# Patient Record
Sex: Female | Born: 1970 | Race: Black or African American | Hispanic: No | Marital: Single | Smoking: Never smoker
Health system: Southern US, Community
[De-identification: ages and names within clinical notes are randomized; demographics above are authoritative.]

## PROBLEM LIST (undated history)

## (undated) DIAGNOSIS — H9192 Unspecified hearing loss, left ear: Secondary | ICD-10-CM

## (undated) DIAGNOSIS — D573 Sickle-cell trait: Secondary | ICD-10-CM

## (undated) DIAGNOSIS — Z973 Presence of spectacles and contact lenses: Secondary | ICD-10-CM

## (undated) DIAGNOSIS — I10 Essential (primary) hypertension: Secondary | ICD-10-CM

## (undated) DIAGNOSIS — K5792 Diverticulitis of intestine, part unspecified, without perforation or abscess without bleeding: Secondary | ICD-10-CM

## (undated) HISTORY — PX: BREAST BIOPSY: SHX20

## (undated) HISTORY — DX: Diverticulitis of intestine, part unspecified, without perforation or abscess without bleeding: K57.92

## (undated) HISTORY — DX: Essential (primary) hypertension: I10

## (undated) HISTORY — DX: Presence of spectacles and contact lenses: Z97.3

## (undated) HISTORY — DX: Unspecified hearing loss, left ear: H91.92

## (undated) HISTORY — PX: TUBAL LIGATION: SHX77

## (undated) HISTORY — DX: Sickle-cell trait: D57.3

---

## 1998-06-23 ENCOUNTER — Inpatient Hospital Stay (HOSPITAL_COMMUNITY): Admission: AD | Admit: 1998-06-23 | Discharge: 1998-06-23 | Payer: Self-pay | Admitting: Gynecology

## 1998-07-11 ENCOUNTER — Inpatient Hospital Stay (HOSPITAL_COMMUNITY): Admission: AD | Admit: 1998-07-11 | Discharge: 1998-07-11 | Payer: Self-pay | Admitting: Obstetrics and Gynecology

## 1998-08-22 ENCOUNTER — Inpatient Hospital Stay (HOSPITAL_COMMUNITY): Admission: AD | Admit: 1998-08-22 | Discharge: 1998-08-25 | Payer: Self-pay | Admitting: Gynecology

## 1998-08-22 ENCOUNTER — Encounter (INDEPENDENT_AMBULATORY_CARE_PROVIDER_SITE_OTHER): Payer: Self-pay

## 1998-08-31 ENCOUNTER — Emergency Department (HOSPITAL_COMMUNITY): Admission: EM | Admit: 1998-08-31 | Discharge: 1998-08-31 | Payer: Self-pay | Admitting: Emergency Medicine

## 1998-08-31 ENCOUNTER — Encounter: Payer: Self-pay | Admitting: Emergency Medicine

## 1998-09-29 ENCOUNTER — Other Ambulatory Visit: Admission: RE | Admit: 1998-09-29 | Discharge: 1998-09-29 | Payer: Self-pay | Admitting: Gynecology

## 1999-01-08 ENCOUNTER — Encounter: Admission: RE | Admit: 1999-01-08 | Discharge: 1999-01-08 | Payer: Self-pay | Admitting: Obstetrics

## 1999-01-08 ENCOUNTER — Other Ambulatory Visit: Admission: RE | Admit: 1999-01-08 | Discharge: 1999-01-08 | Payer: Self-pay | Admitting: Obstetrics

## 2007-01-19 ENCOUNTER — Emergency Department (HOSPITAL_COMMUNITY): Admission: EM | Admit: 2007-01-19 | Discharge: 2007-01-19 | Payer: Self-pay | Admitting: Emergency Medicine

## 2007-01-23 ENCOUNTER — Encounter: Admission: RE | Admit: 2007-01-23 | Discharge: 2007-01-23 | Payer: Self-pay | Admitting: Emergency Medicine

## 2007-01-24 ENCOUNTER — Encounter (INDEPENDENT_AMBULATORY_CARE_PROVIDER_SITE_OTHER): Payer: Self-pay | Admitting: Diagnostic Radiology

## 2007-01-24 ENCOUNTER — Encounter: Admission: RE | Admit: 2007-01-24 | Discharge: 2007-01-24 | Payer: Self-pay | Admitting: Emergency Medicine

## 2007-03-07 ENCOUNTER — Encounter: Admission: RE | Admit: 2007-03-07 | Discharge: 2007-03-07 | Payer: Self-pay | Admitting: Emergency Medicine

## 2007-03-21 ENCOUNTER — Encounter: Admission: RE | Admit: 2007-03-21 | Discharge: 2007-03-21 | Payer: Self-pay | Admitting: Emergency Medicine

## 2007-03-28 ENCOUNTER — Encounter: Admission: RE | Admit: 2007-03-28 | Discharge: 2007-03-28 | Payer: Self-pay | Admitting: Emergency Medicine

## 2007-06-26 ENCOUNTER — Encounter: Admission: RE | Admit: 2007-06-26 | Discharge: 2007-06-26 | Payer: Self-pay | Admitting: Emergency Medicine

## 2010-03-29 ENCOUNTER — Encounter: Payer: Self-pay | Admitting: Emergency Medicine

## 2010-09-27 ENCOUNTER — Emergency Department (HOSPITAL_COMMUNITY)
Admission: EM | Admit: 2010-09-27 | Discharge: 2010-09-27 | Disposition: A | Discharge status: Home / Self Care | Payer: Self-pay | Attending: Emergency Medicine | Admitting: Emergency Medicine

## 2010-09-27 ENCOUNTER — Emergency Department (HOSPITAL_COMMUNITY): Payer: Self-pay

## 2010-09-27 ENCOUNTER — Inpatient Hospital Stay (INDEPENDENT_AMBULATORY_CARE_PROVIDER_SITE_OTHER)
Admission: RE | Admit: 2010-09-27 | Discharge: 2010-09-27 | Disposition: A | Discharge status: Home / Self Care | Payer: Self-pay | Source: Ambulatory Visit | Attending: Family Medicine | Admitting: Family Medicine

## 2010-09-27 DIAGNOSIS — H538 Other visual disturbances: Secondary | ICD-10-CM | POA: Insufficient documentation

## 2010-09-27 DIAGNOSIS — R51 Headache: Secondary | ICD-10-CM

## 2010-09-27 DIAGNOSIS — H53149 Visual discomfort, unspecified: Secondary | ICD-10-CM | POA: Insufficient documentation

## 2010-09-27 DIAGNOSIS — R112 Nausea with vomiting, unspecified: Secondary | ICD-10-CM | POA: Insufficient documentation

## 2010-09-27 DIAGNOSIS — G43909 Migraine, unspecified, not intractable, without status migrainosus: Secondary | ICD-10-CM | POA: Insufficient documentation

## 2010-09-27 LAB — POCT I-STAT, CHEM 8
BUN: 15 mg/dL (ref 6–23)
Calcium, Ion: 1.02 mmol/L — ABNORMAL LOW (ref 1.12–1.32)
Creatinine, Ser: 0.7 mg/dL (ref 0.50–1.10)
Glucose, Bld: 80 mg/dL (ref 70–99)
Potassium: 3.5 mEq/L (ref 3.5–5.1)
Sodium: 138 mEq/L (ref 135–145)

## 2010-10-16 ENCOUNTER — Emergency Department (HOSPITAL_COMMUNITY)
Admission: EM | Admit: 2010-10-16 | Discharge: 2010-10-16 | Disposition: A | Discharge status: Home / Self Care | Payer: No Typology Code available for payment source | Attending: Emergency Medicine | Admitting: Emergency Medicine

## 2010-10-16 DIAGNOSIS — M542 Cervicalgia: Secondary | ICD-10-CM | POA: Insufficient documentation

## 2010-10-16 DIAGNOSIS — Y92410 Unspecified street and highway as the place of occurrence of the external cause: Secondary | ICD-10-CM | POA: Insufficient documentation

## 2010-10-16 DIAGNOSIS — M545 Low back pain, unspecified: Secondary | ICD-10-CM | POA: Insufficient documentation

## 2011-08-10 ENCOUNTER — Encounter (HOSPITAL_COMMUNITY): Payer: Self-pay | Admitting: Emergency Medicine

## 2011-08-10 ENCOUNTER — Emergency Department (INDEPENDENT_AMBULATORY_CARE_PROVIDER_SITE_OTHER)
Admission: EM | Admit: 2011-08-10 | Discharge: 2011-08-10 | Disposition: A | Discharge status: Home / Self Care | Payer: BC Managed Care – PPO | Source: Home / Self Care | Attending: Emergency Medicine | Admitting: Emergency Medicine

## 2011-08-10 DIAGNOSIS — R42 Dizziness and giddiness: Secondary | ICD-10-CM

## 2011-08-10 DIAGNOSIS — R220 Localized swelling, mass and lump, head: Secondary | ICD-10-CM

## 2011-08-10 LAB — GLUCOSE, CAPILLARY: Glucose-Capillary: 111 mg/dL — ABNORMAL HIGH (ref 70–99)

## 2011-08-10 MED ORDER — OMEPRAZOLE 20 MG PO CPDR: 40.0000 mg | DELAYED_RELEASE_CAPSULE | Freq: Every day | ORAL | Status: DC

## 2011-08-10 MED ORDER — CEPHALEXIN 500 MG PO CAPS: 500.0000 mg | ORAL_CAPSULE | Freq: Three times a day (TID) | ORAL | Status: AC

## 2011-08-10 NOTE — Discharge Instructions (Signed)
1- skull scar or fibrotic tissue. Will need to be excised electively by a surgeon or cosmetic surgeon. Once you establish her primary care Dr. this becomes a priority for you informed and they will prefer you.   2-if you get to express any symptoms such as chest pains, the sensation that he doesn't pass out, palpitations, chest pains, numbness or weakness or visual changes he should go to the emergency department or call 911.  3-can take omeprazole for your discomfort as you probably irritated your esophagus.       Dizziness Dizziness is a common problem. It is a feeling of unsteadiness or lightheadedness. You may feel like you are about to faint. Dizziness can lead to injury if you stumble or fall. A person of any age group can suffer from dizziness, but dizziness is more common in older adults. CAUSES  Dizziness can be caused by many different things, including:  Middle ear problems.   Standing for too long.   Infections.   An allergic reaction.   Aging.   An emotional response to something, such as the sight of blood.   Side effects of medicines.   Fatigue.   Problems with circulation or blood pressure.   Excess use of alcohol, medicines, or illegal drug use.   Breathing too fast (hyperventilation).   An arrhythmia or problems with your heart rhythm.   Low red blood cell count (anemia).   Pregnancy.   Vomiting, diarrhea, fever, or other illnesses that cause dehydration.   Diseases or conditions such as Parkinson's disease, high blood pressure (hypertension), diabetes, and thyroid problems.   Exposure to extreme heat.  DIAGNOSIS  To find the cause of your dizziness, your caregiver may do a physical exam, lab tests, radiologic imaging scans, or an electrocardiography test (ECG).  TREATMENT  Treatment of dizziness depends on the cause of your symptoms and can vary greatly. HOME CARE INSTRUCTIONS   Drink enough fluids to keep your urine clear or pale yellow.  This is especially important in very hot weather. In the elderly, it is also important in cold weather.   If your dizziness is caused by medicines, take them exactly as directed. When taking blood pressure medicines, it is especially important to get up slowly.   Rise slowly from chairs and steady yourself until you feel okay.   In the morning, first sit up on the side of the bed. When this seems okay, stand slowly while holding onto something until you know your balance is fine.   If you need to stand in one place for a long time, be sure to move your legs often. Tighten and relax the muscles in your legs while standing.   If dizziness continues to be a problem, have someone stay with you for a day or two. Do this until you feel you are well enough to stay alone. Have the person call your caregiver if he or she notices changes in you that are concerning.   Do not drive or use heavy machinery if you feel dizzy.  SEEK IMMEDIATE MEDICAL CARE IF:   Your dizziness or lightheadedness gets worse.   You feel nauseous or vomit.   You develop problems with talking, walking, weakness, or using your arms, hands, or legs.   You are not thinking clearly or you have difficulty forming sentences. It may take a friend or family member to determine if your thinking is normal.   You develop chest pain, abdominal pain, shortness of breath, or sweating.  Your vision changes.   You notice any bleeding.   You have side effects from medicine that seems to be getting worse rather than better.  MAKE SURE YOU:   Understand these instructions.   Will watch your condition.   Will get help right away if you are not doing well or get worse.  Document Released: 08/18/2000 Document Revised: 02/11/2011 Document Reviewed: 09/11/2010 [facility] Patient Information 2012 [place], [place].

## 2011-08-10 NOTE — ED Provider Notes (Signed)
History     CSN: [id number]  Arrival date & time 08/10/11  1126   First MD Initiated Contact with Patient 08/10/11 1143      Chief Complaint  Patient presents with  . Hair/Scalp Problem    Pt has knot on scalp x 1 year. Once had to be "drained". Has been sore for a long time but got worse today. She feels it has made her have headaches, nausea and dizziness. Had near syncopal episode this am while at work.  . Dizziness    (Consider location/radiation/quality/duration/timing/severity/associated sxs/prior treatment) HPI Comments: Patient is urgent care complaining of an ongoing left-sided scalp boil-like-looking structure for more than a year that has been bothering her more than the last week that seemed to be given her a headache as well as it's tender all over the area. Patient describes she was at work and she was about to pass out after experiencing an episode of nausea and dizziness she wa she  recently eaten something that she felt got stuck in her stomach that made her feel bad to the point that made her feel nauseous   The history is provided by the patient.    History reviewed. No pertinent past medical history.  History reviewed. No pertinent past surgical history.  History reviewed. No pertinent family history.  History  Substance Use Topics  . Smoking status: Never Smoker   . Smokeless tobacco: Not on file  . Alcohol Use: No    OB History    Grav Para Term Preterm Abortions TAB SAB Ect Mult Living                  Review of Systems  Constitutional: Positive for activity change and appetite change. Negative for fatigue.  Respiratory: Negative for cough and shortness of breath.   Gastrointestinal: Positive for nausea. Negative for vomiting and abdominal pain.  Skin: Negative for rash.  Neurological: Positive for dizziness and headaches. Negative for seizures, syncope, facial asymmetry, speech difficulty, weakness, light-headedness and numbness.    Allergies    Review of patient's allergies indicates no known allergies.  Home Medications   Current Outpatient Rx  Name Route Sig Dispense Refill  . CEPHALEXIN 500 MG PO CAPS Oral Take 1 capsule (500 mg total) by mouth 3 (three) times daily. 21 capsule 0  . OMEPRAZOLE 20 MG PO CPDR Oral Take 2 capsules (40 mg total) by mouth daily. 30 capsule 0    BP 143/72  Pulse 72  Temp(Src) 98.1 F (36.7 C) (Oral)  Resp 16  SpO2 98%  LMP 08/08/2011  Physical Exam  Nursing note and vitals reviewed. Constitutional: She appears well-developed and well-nourished.  HENT:  Head: Normocephalic.    Eyes: Conjunctivae are normal. Right eye exhibits no discharge. Left eye exhibits no discharge.  Neck: Neck supple.  Cardiovascular: Normal rate, regular rhythm, normal heart sounds and intact distal pulses.  Exam reveals no friction rub.   No murmur heard. Pulmonary/Chest: Effort normal and breath sounds normal.  Lymphadenopathy:    She has no cervical adenopathy.  Skin: Skin is warm. No erythema.    ED Course  Procedures (including critical care time)  Labs Reviewed  GLUCOSE, CAPILLARY - Abnormal; Notable for the following:    Glucose-Capillary 111 (*)    All other components within normal limits   No results found.   1. Scalp lump   2. Dizziness       MDM  Patient presents with an ongoing complaint about a left-sided  scalp recurrent soft tissue swelling and infection it has been active for the last week. Also presents with some gastrointestinal symptoms after having eaten something at work. It makes her feel.dizzy and nauseous and almost felt like she was going pass out. Patient is asymptomatic during exam with the exception of tenderness in her left scalp area which he had a localized swelling ON HER SCALP.        Jimmie Molly, MD 08/10/11 2110

## 2012-02-17 ENCOUNTER — Encounter (HOSPITAL_COMMUNITY): Payer: Self-pay | Admitting: Emergency Medicine

## 2012-02-17 ENCOUNTER — Emergency Department (HOSPITAL_COMMUNITY)
Admission: EM | Admit: 2012-02-17 | Discharge: 2012-02-17 | Disposition: A | Discharge status: Home / Self Care | Payer: BC Managed Care – PPO | Attending: Emergency Medicine | Admitting: Emergency Medicine

## 2012-02-17 DIAGNOSIS — L02818 Cutaneous abscess of other sites: Secondary | ICD-10-CM | POA: Insufficient documentation

## 2012-02-17 DIAGNOSIS — L0291 Cutaneous abscess, unspecified: Secondary | ICD-10-CM

## 2012-02-17 DIAGNOSIS — L03818 Cellulitis of other sites: Secondary | ICD-10-CM | POA: Insufficient documentation

## 2012-02-17 MED ORDER — LIDOCAINE-EPINEPHRINE 2 %-1:100000 IJ SOLN
10.0000 mL | Freq: Once | INTRAMUSCULAR | Status: AC
  Administered 2012-02-17: 10 mL via INTRADERMAL

## 2012-02-17 MED ORDER — OXYCODONE-ACETAMINOPHEN 5-325 MG PO TABS
1.0000 | ORAL_TABLET | Freq: Once | ORAL | Status: AC
  Administered 2012-02-17: 1 via ORAL
  Filled 2012-02-17: qty 1

## 2012-02-17 MED ORDER — CEPHALEXIN 500 MG PO CAPS: 500.0000 mg | ORAL_CAPSULE | Freq: Four times a day (QID) | ORAL | Status: DC

## 2012-02-17 MED ORDER — OXYCODONE-ACETAMINOPHEN 5-325 MG PO TABS: 1.0000 | ORAL_TABLET | Freq: Once | ORAL | Status: DC

## 2012-02-17 MED ORDER — CEPHALEXIN 500 MG PO CAPS
500.0000 mg | ORAL_CAPSULE | Freq: Once | ORAL | Status: AC
  Administered 2012-02-17: 500 mg via ORAL
  Filled 2012-02-17: qty 1

## 2012-02-17 NOTE — ED Notes (Signed)
Raised area on top of head. 2 cm diameter. Pt stated that she has green , cloudy, malodorous drainage x 3 months

## 2012-02-17 NOTE — ED Provider Notes (Signed)
History     CSN: [id number]  Arrival date & time 02/17/12  1047   First MD Initiated Contact with Patient 02/17/12 1112      Chief Complaint  Patient presents with  . Abscess    2 cm diameter raised area on top of headx 3 months    (Consider location/radiation/quality/duration/timing/severity/associated sxs/prior treatment) Patient is a 41 y.o. female presenting with abscess. The history is provided by the patient.  Abscess  This is a new problem. The current episode started more than one week ago. The abscess is present on the scalp. The problem is moderate. The abscess is characterized by painfulness. Pertinent negatives include no fever. Associated symptoms comments: Large, painful swollen area to parietal scalp x 2 weeks. Reports sometimes it drains with malodorous material. No fever. No history of abscesses previously.Marland Kitchen    History reviewed. No pertinent past medical history.  Past Surgical History  Procedure Date  . Cesarean section     Family History  Problem Relation Age of Onset  . Hypertension Mother   . Hypertension Father   . Asthma Sister     History  Substance Use Topics  . Smoking status: Never Smoker   . Smokeless tobacco: Not on file  . Alcohol Use: No    OB History    Grav Para Term Preterm Abortions TAB SAB Ect Mult Living                  Review of Systems  Constitutional: Negative for fever and chills.  Gastrointestinal: Negative for nausea.  Musculoskeletal: Negative.   Skin:       C/O Abscess.    Allergies  Review of patient's allergies indicates no known allergies.  Home Medications  No current outpatient prescriptions on file.  BP 128/74  Pulse 77  Temp 98.4 F (36.9 C) (Oral)  Resp 18  Wt 240 lb (108.863 kg)  SpO2 99%  LMP 02/07/2012  Physical Exam  Constitutional: She is oriented to person, place, and time. She appears well-developed and well-nourished.  HENT:  Head: Normocephalic.  Neck: Normal range of motion.   Pulmonary/Chest: Effort normal.  Neurological: She is alert and oriented to person, place, and time.  Skin: Skin is warm and dry.       Large, circular swelling left parietal scalp with fluctuance and central ulceration that is crusted. No active drainage. No surrounding redness, induration or fluctuance.    ED Course  Procedures (including critical care time)  Labs Reviewed - No data to display No results found. INCISION AND DRAINAGE Performed by: Langley Adie A Consent: Verbal consent obtained. Risks and benefits: risks, benefits and alternatives were discussed Type: abscess  Body area: scalp  Anesthesia: local infiltration  Incision was made with a scalpel.  Local anesthetic: lidocaine 1% w/ epinephrine  Anesthetic total: 1 ml  Complexity: complex #11 blade used to make incision Blunt dissection to break up loculations   Drainage: purulent  Drainage amount: none  Packing material: 1/4 in iodoform gauze  Patient tolerance: Patient tolerated the procedure well with no immediate complications.     No diagnosis found.  1. Abscess, scalp   MDM  Abnormal appearance to interior of lesion. No purulent material. There is grayish, hard substance central wound along floor. Suggested dermatologic follow up for further evaluation.        Rodena Medin, PA-C 02/17/12 1320

## 2012-02-17 NOTE — ED Provider Notes (Signed)
Medical screening examination/treatment/procedure(s) were performed by non-physician practitioner and as supervising physician I was immediately available for consultation/collaboration.  Theophil Thivierge, MD 02/17/12 1412 

## 2012-04-14 ENCOUNTER — Encounter (HOSPITAL_BASED_OUTPATIENT_CLINIC_OR_DEPARTMENT_OTHER): Payer: Self-pay | Admitting: *Deleted

## 2012-04-20 SURGERY — CYST REMOVAL
Anesthesia: General | Site: Scalp

## 2012-04-21 ENCOUNTER — Other Ambulatory Visit: Payer: Self-pay | Admitting: Plastic Surgery

## 2012-04-21 DIAGNOSIS — L729 Follicular cyst of the skin and subcutaneous tissue, unspecified: Secondary | ICD-10-CM

## 2012-04-21 NOTE — H&P (Signed)
This document contains confidential information from a [facility] medical record system and may be unauthenticated. Release may be made only with a valid authorization or in accordance with applicable policies of Medical Center or its affiliates. This document must be maintained in a secure manner or discarded/destroyed as required by Medical Center policy or by a confidential means such as shredding.   Katherine Hurst   04/18/2012 8:45 AM Office Visit  MRN: [id number]  Department: [facility]  Dept Phone: [phone]  Description: Female DOB: 02/22/1971  Provider: Shawn Montgomery Rayburn, PA-C   Diagnoses  -  Scalp cyst   - Primary   706.2      Dx: Sebaceous cyst [706.2]   Vitals - Last Recorded     129/83  76  98.5 F (36.9 C) (Oral)          Subjective:    Patient ID: Katherine Hurst is a 41 y.o. female.  HPI The patient is a 41 yrs old bf here with family for history and physical for excision of a scalp cyst.  She has been dealing with the area for a year.  It fills, drains and then heals to only open again a few weeks later.  She had the area excised by Dr. Gruber but unfortunately it has filled back up.  The area is slightly tender but nothing is draining at the moment. There is no sign of infection.  The area is slightly fluctuant and a healing skin area from the previous incision.  There are no palpable nodes in the neck.   She is otherwise healthy and desires to have the cyst removed.   The following portions of the patient's history were reviewed and updated as appropriate: allergies, current medications, past family history, past medical history, past social history, past surgical history and problem list.  Review of Systems  Constitutional: Negative.   HENT: Negative.   Eyes: Negative.   Respiratory: Negative.   Cardiovascular: Negative.   Gastrointestinal: Negative.   Genitourinary: Negative.   Musculoskeletal: Negative.   Neurological:  Negative.   Hematological: Negative.   Psychiatric/Behavioral: Negative.       Objective:    Physical Exam  Constitutional: She is oriented to person, place, and time. She appears well-developed and well-nourished. No distress.  HENT:   Head: Normocephalic and atraumatic.   Nose: Nose normal.   Mouth/Throat: Oropharynx is clear and moist.       Left parietal scalp cyst is palpable, non tender, and immobile. There is some widened scar where the lesion was excised previously  Eyes: EOM are normal. Pupils are equal, round, and reactive to light.  Neck: Normal range of motion. Neck supple. No JVD present. No tracheal deviation present.  Cardiovascular: Normal rate, regular rhythm, normal heart sounds and intact distal pulses.   Pulmonary/Chest: Effort normal. No stridor. No respiratory distress. She has no wheezes. She exhibits no tenderness.  Abdominal: Soft. She exhibits no distension and no mass. There is no tenderness. There is no guarding.  Musculoskeletal: Normal range of motion.  Lymphadenopathy:    She has no cervical adenopathy.  Neurological: She is alert and oriented to person, place, and time.  Skin: Skin is warm and dry.  Psychiatric: She has a normal mood and affect. Her behavior is normal. Judgment and thought content normal.     Assessment:   1.  Scalp cyst        Plan:   Plan cyst excision in the   OR. The procedure, possible risks and benefits were discussed with the patient and questions answered and she wishes to proceed. Consent was obtained.   

## 2012-04-21 NOTE — Progress Notes (Signed)
Reviewed preop-was r/s from 04/20/12 to 04/26/12

## 2012-04-23 ENCOUNTER — Ambulatory Visit (HOSPITAL_BASED_OUTPATIENT_CLINIC_OR_DEPARTMENT_OTHER): Admission: RE | Admit: 2012-04-23 | Payer: BC Managed Care – PPO | Source: Ambulatory Visit | Admitting: Plastic Surgery

## 2012-04-26 ENCOUNTER — Ambulatory Visit (HOSPITAL_BASED_OUTPATIENT_CLINIC_OR_DEPARTMENT_OTHER)
Admission: RE | Admit: 2012-04-26 | Discharge: 2012-04-26 | Disposition: A | Discharge status: Home / Self Care | Payer: BC Managed Care – PPO | Source: Ambulatory Visit | Attending: Plastic Surgery | Admitting: Plastic Surgery

## 2012-04-26 ENCOUNTER — Encounter (HOSPITAL_BASED_OUTPATIENT_CLINIC_OR_DEPARTMENT_OTHER): Payer: Self-pay | Admitting: Plastic Surgery

## 2012-04-26 ENCOUNTER — Encounter (HOSPITAL_BASED_OUTPATIENT_CLINIC_OR_DEPARTMENT_OTHER)
Admission: RE | Disposition: A | Discharge status: Home / Self Care | Payer: Self-pay | Source: Ambulatory Visit | Attending: Plastic Surgery

## 2012-04-26 ENCOUNTER — Encounter (HOSPITAL_BASED_OUTPATIENT_CLINIC_OR_DEPARTMENT_OTHER): Payer: Self-pay | Admitting: Anesthesiology

## 2012-04-26 ENCOUNTER — Ambulatory Visit (HOSPITAL_BASED_OUTPATIENT_CLINIC_OR_DEPARTMENT_OTHER): Payer: BC Managed Care – PPO | Admitting: Anesthesiology

## 2012-04-26 DIAGNOSIS — L7212 Trichodermal cyst: Secondary | ICD-10-CM | POA: Insufficient documentation

## 2012-04-26 DIAGNOSIS — L729 Follicular cyst of the skin and subcutaneous tissue, unspecified: Secondary | ICD-10-CM | POA: Diagnosis present

## 2012-04-26 DIAGNOSIS — D234 Other benign neoplasm of skin of scalp and neck: Secondary | ICD-10-CM | POA: Insufficient documentation

## 2012-04-26 HISTORY — PX: EAR CYST EXCISION: SHX22

## 2012-04-26 LAB — POCT HEMOGLOBIN-HEMACUE: Hemoglobin: 11.5 g/dL — ABNORMAL LOW (ref 12.0–15.0)

## 2012-04-26 SURGERY — CYST REMOVAL
Anesthesia: General | Site: Scalp | Wound class: Clean

## 2012-04-26 MED ORDER — ONDANSETRON HCL 4 MG/2ML IJ SOLN: 4.0000 mg | Freq: Once | INTRAMUSCULAR | Status: DC | PRN

## 2012-04-26 MED ORDER — LIDOCAINE-EPINEPHRINE 1 %-1:100000 IJ SOLN
INTRAMUSCULAR | Status: DC | PRN
  Administered 2012-04-26: 3 mL

## 2012-04-26 MED ORDER — MIDAZOLAM HCL 2 MG/2ML IJ SOLN: 1.0000 mg | INTRAMUSCULAR | Status: DC | PRN

## 2012-04-26 MED ORDER — PROPOFOL 10 MG/ML IV BOLUS
INTRAVENOUS | Status: DC | PRN
  Administered 2012-04-26: 200 mg via INTRAVENOUS

## 2012-04-26 MED ORDER — FENTANYL CITRATE 0.05 MG/ML IJ SOLN: 50.0000 ug | INTRAMUSCULAR | Status: DC | PRN

## 2012-04-26 MED ORDER — MIDAZOLAM HCL 5 MG/5ML IJ SOLN
INTRAMUSCULAR | Status: DC | PRN
  Administered 2012-04-26: 1 mg via INTRAVENOUS

## 2012-04-26 MED ORDER — LACTATED RINGERS IV SOLN
INTRAVENOUS | Status: DC
  Administered 2012-04-26 (×2): via INTRAVENOUS

## 2012-04-26 MED ORDER — ONDANSETRON HCL 4 MG/2ML IJ SOLN
INTRAMUSCULAR | Status: DC | PRN
  Administered 2012-04-26: 4 mg via INTRAVENOUS

## 2012-04-26 MED ORDER — OXYCODONE HCL 5 MG PO TABS: 5.0000 mg | ORAL_TABLET | Freq: Once | ORAL | Status: DC | PRN

## 2012-04-26 MED ORDER — FENTANYL CITRATE 0.05 MG/ML IJ SOLN
INTRAMUSCULAR | Status: DC | PRN
  Administered 2012-04-26: 50 ug via INTRAVENOUS

## 2012-04-26 MED ORDER — CEFAZOLIN SODIUM-DEXTROSE 2-3 GM-% IV SOLR
2.0000 g | INTRAVENOUS | Status: AC
  Administered 2012-04-26: 2 g via INTRAVENOUS

## 2012-04-26 MED ORDER — DEXAMETHASONE SODIUM PHOSPHATE 4 MG/ML IJ SOLN
INTRAMUSCULAR | Status: DC | PRN
  Administered 2012-04-26: 10 mg via INTRAVENOUS

## 2012-04-26 MED ORDER — LIDOCAINE HCL (CARDIAC) 20 MG/ML IV SOLN
INTRAVENOUS | Status: DC | PRN
  Administered 2012-04-26: 75 mg via INTRAVENOUS

## 2012-04-26 MED ORDER — HYDROMORPHONE HCL PF 1 MG/ML IJ SOLN
0.2500 mg | INTRAMUSCULAR | Status: DC | PRN
  Administered 2012-04-26: 0.5 mg via INTRAVENOUS

## 2012-04-26 MED ORDER — OXYCODONE HCL 5 MG/5ML PO SOLN: 5.0000 mg | Freq: Once | ORAL | Status: DC | PRN

## 2012-04-26 SURGICAL SUPPLY — 49 items
BLADE SURG 15 STRL LF DISP TIS (BLADE) ×1 IMPLANT
BLADE SURG 15 STRL SS (BLADE) ×1
BLADE SURG ROTATE 9660 (MISCELLANEOUS) IMPLANT
CANISTER SUCTION 1200CC (MISCELLANEOUS) ×2 IMPLANT
CHLORAPREP W/TINT 26ML (MISCELLANEOUS) IMPLANT
CLOTH BEACON ORANGE TIMEOUT ST (SAFETY) ×2 IMPLANT
CORDS BIPOLAR (ELECTRODE) IMPLANT
COVER MAYO STAND STRL (DRAPES) ×2 IMPLANT
COVER TABLE BACK 60X90 (DRAPES) ×2 IMPLANT
DERMABOND ADVANCED (GAUZE/BANDAGES/DRESSINGS)
DERMABOND ADVANCED .7 DNX12 (GAUZE/BANDAGES/DRESSINGS) IMPLANT
DRAPE U-SHAPE 76X120 STRL (DRAPES) ×2 IMPLANT
DRSG TEGADERM 2-3/8X2-3/4 SM (GAUZE/BANDAGES/DRESSINGS) IMPLANT
ELECT COATED BLADE 2.86 ST (ELECTRODE) IMPLANT
ELECT NEEDLE BLADE 2-5/6 (NEEDLE) ×2 IMPLANT
ELECT REM PT RETURN 9FT ADLT (ELECTROSURGICAL) ×2
ELECT REM PT RETURN 9FT PED (ELECTROSURGICAL)
ELECTRODE REM PT RETRN 9FT PED (ELECTROSURGICAL) IMPLANT
ELECTRODE REM PT RTRN 9FT ADLT (ELECTROSURGICAL) ×1 IMPLANT
GAUZE SPONGE 4X4 12PLY STRL LF (GAUZE/BANDAGES/DRESSINGS) IMPLANT
GLOVE BIO SURGEON STRL SZ 6.5 (GLOVE) ×4 IMPLANT
GLOVE SKINSENSE NS SZ6.5 (GLOVE) ×1
GLOVE SKINSENSE STRL SZ6.5 (GLOVE) ×1 IMPLANT
GOWN PREVENTION PLUS XLARGE (GOWN DISPOSABLE) ×6 IMPLANT
NEEDLE 27GAX1X1/2 (NEEDLE) ×2 IMPLANT
NEEDLE HYPO 30GX1 BEV (NEEDLE) ×2 IMPLANT
NS IRRIG 1000ML POUR BTL (IV SOLUTION) IMPLANT
PACK BASIN DAY SURGERY FS (CUSTOM PROCEDURE TRAY) ×2 IMPLANT
PENCIL BUTTON HOLSTER BLD 10FT (ELECTRODE) ×2 IMPLANT
RUBBERBAND STERILE (MISCELLANEOUS) IMPLANT
SHEET MEDIUM DRAPE 40X70 STRL (DRAPES) IMPLANT
SPONGE GAUZE 2X2 8PLY STRL LF (GAUZE/BANDAGES/DRESSINGS) IMPLANT
STRIP CLOSURE SKIN 1/2X4 (GAUZE/BANDAGES/DRESSINGS) IMPLANT
SUCTION FRAZIER TIP 10 FR DISP (SUCTIONS) ×2 IMPLANT
SUT ETHILON 5 0 P 3 18 (SUTURE)
SUT MNCRL 6-0 UNDY P1 1X18 (SUTURE) ×1 IMPLANT
SUT MNCRL AB 4-0 PS2 18 (SUTURE) IMPLANT
SUT MON AB 5-0 P3 18 (SUTURE) IMPLANT
SUT MON AB 5-0 PS2 18 (SUTURE) ×2 IMPLANT
SUT MONOCRYL 6-0 P1 1X18 (SUTURE) ×1
SUT NYLON ETHILON 5-0 P-3 1X18 (SUTURE) IMPLANT
SUT PLAIN 5 0 P 3 18 (SUTURE) IMPLANT
SUT VIC AB 5-0 P-3 18X BRD (SUTURE) IMPLANT
SUT VIC AB 5-0 P3 18 (SUTURE)
SUT VICRYL 4-0 PS2 18IN ABS (SUTURE) IMPLANT
SYR BULB 3OZ (MISCELLANEOUS) IMPLANT
SYR CONTROL 10ML LL (SYRINGE) ×2 IMPLANT
TRAY DSU PREP LF (CUSTOM PROCEDURE TRAY) ×2 IMPLANT
TUBE CONNECTING 20X1/4 (TUBING) ×2 IMPLANT

## 2012-04-26 NOTE — Op Note (Signed)
NAMEMarland Kitchen  Katherine Hurst, Katherine Hurst NO.:  [id number]  MEDICAL RECORD NO.:  [id number]  LOCATION:[facility]           FACILITY:  [facility]  PHYSICIAN:  Wayland Denis, DO      DATE OF BIRTH:  1970-08-05  DATE OF PROCEDURE:  04/26/2012 DATE OF DISCHARGE:                                OPERATIVE REPORT   PREOPERATIVE DIAGNOSIS:  Scalp cyst.  POSTOPERATIVE DIAGNOSIS:  Scalp cyst.  PROCEDURE:  Excision of scalp cyst 1 cm.  SURGEON:  Wayland Denis, DO  ASSISTANT:  Harrie Foreman, LPN.  ANESTHESIA:  General.  INDICATION FOR PROCEDURE:  The patient is a 42 year old female who presents with a recurrent scalp cyst.  Risks and complications were reviewed and included bleeding, pain, scar, and risk of anesthesia. Consent was signed and confirmed and she wished to proceed.  DESCRIPTION OF PROCEDURE:  The patient was taken to the operating room, placed on the operating room table in supine position.  General anesthesia was administered.  Once adequate, a time-out was called and all information was confirmed to be correct.  She was prepped and draped in the usual sterile fashion.  A 1% lidocaine with epinephrine was injected around the area.  After the time-out, the 15 blade was used to make an elliptical incision around the cyst in order to excise it completely.  The cyst was quite deep through the scalp, but not into the pericranium, that covering was left intact, 1 cm was the size.  It was irrigated with the lidocaine and the deep layer was closed with a 5-0 Monocryl.  Vertical mattress sutures were then used to reapproximate the skin edges.  The patient tolerated the procedure well.  She was allowed to wake up, extubated, and taken to recovery room in stable condition.     Wayland Denis, DO     CS/MEDQ  D:  04/26/2012  T:  04/26/2012  Job:  [id number]

## 2012-04-26 NOTE — Brief Op Note (Signed)
04/26/2012  10:08 AM  PATIENT:  Katherine Hurst  42 y.o. female  PRE-OPERATIVE DIAGNOSIS:  scalp cyst  POST-OPERATIVE DIAGNOSIS:  Scalp cyst  PROCEDURE:  Procedure(s): EXCISION OF SCALP CYST (N/A)  SURGEON:  Surgeon(s) and Role:    * Claire Sanger, DO - Primary  PHYSICIAN ASSISTANT:   ASSISTANTS: Harrie Foreman, LPN   ANESTHESIA:   general  EBL:     BLOOD ADMINISTERED:none  DRAINS: none   LOCAL MEDICATIONS USED:  NONE  SPECIMEN:  Source of Specimen:  scalp cyst  DISPOSITION OF SPECIMEN:  path  COUNTS:  YES  TOURNIQUET:  * No tourniquets in log *  DICTATION: dictation  PLAN OF CARE: Discharge to home after PACU  PATIENT DISPOSITION:  PACU - hemodynamically stable.   Delay start of Pharmacological VTE agent (>24hrs) due to surgical blood loss or risk of bleeding: no

## 2012-04-26 NOTE — Anesthesia Postprocedure Evaluation (Signed)
  Anesthesia Post-op Note  Patient: Katherine Hurst  Procedure(s) Performed: Procedure(s): EXCISION OF SCALP CYST (N/A)  Patient Location: PACU  Anesthesia Type:General  Level of Consciousness: awake, alert  and oriented  Airway and Oxygen Therapy: Patient Spontanous Breathing  Post-op Pain: mild  Post-op Assessment: Post-op Vital signs reviewed  Post-op Vital Signs: Reviewed  Complications: No apparent anesthesia complications

## 2012-04-26 NOTE — Anesthesia Preprocedure Evaluation (Signed)
Anesthesia Evaluation  Patient identified by MRN, date of birth, ID band Patient awake    Reviewed: Allergy & Precautions, H&P , NPO status , Patient's Chart, lab work & pertinent test results  Airway Mallampati: I TM Distance: >3 FB Neck ROM: Full    Dental  (+) Teeth Intact and Dental Advisory Given   Pulmonary  breath sounds clear to auscultation        Cardiovascular Rhythm:Regular Rate:Normal     Neuro/Psych    GI/Hepatic   Endo/Other  Morbid obesity  Renal/GU      Musculoskeletal   Abdominal   Peds  Hematology   Anesthesia Other Findings   Reproductive/Obstetrics                           Anesthesia Physical Anesthesia Plan  ASA: II  Anesthesia Plan: General   Post-op Pain Management:    Induction: Intravenous  Airway Management Planned: LMA  Additional Equipment:   Intra-op Plan:   Post-operative Plan: Extubation in OR  Informed Consent: I have reviewed the patients History and Physical, chart, labs and discussed the procedure including the risks, benefits and alternatives for the proposed anesthesia with the patient or authorized representative who has indicated his/her understanding and acceptance.   Dental advisory given  Plan Discussed with: Anesthesiologist, CRNA and Surgeon  Anesthesia Plan Comments:         Anesthesia Quick Evaluation

## 2012-04-26 NOTE — Interval H&P Note (Signed)
History and Physical Interval Note:  04/26/2012 8:23 AM  Emmaline Kluver LIESL SIMONS  has presented today for surgery, with the diagnosis of scalp cyst  The various methods of treatment have been discussed with the patient and family. After consideration of risks, benefits and other options for treatment, the patient has consented to  Procedure(s): EXCISION OF SCALP CYST (N/A) as a surgical intervention .  The patient's history has been reviewed, patient examined, no change in status, stable for surgery.  I have reviewed the patient's chart and labs.  Questions were answered to the patient's satisfaction.     SANGER,CLAIRE

## 2012-04-26 NOTE — Op Note (Deleted)
NAME:  Katherine Hurst, Katherine Hurst                 ACCOUNT NO.:  [id number]  MEDICAL RECORD NO.:  [id number]  LOCATION:[facility]           FACILITY:  [facility]  PHYSICIAN:  Claire Sanger, DO      DATE OF BIRTH:  03/30/1970  DATE OF PROCEDURE:  04/26/2012 DATE OF DISCHARGE:                                OPERATIVE REPORT   PREOPERATIVE DIAGNOSIS:  Scalp cyst.  POSTOPERATIVE DIAGNOSIS:  Scalp cyst.  PROCEDURE:  Excision of scalp cyst 1 cm.  SURGEON:  Claire Sanger, DO  ASSISTANT:  Mindy Toth, LPN.  ANESTHESIA:  General.  INDICATION FOR PROCEDURE:  The patient is a 41-year-old female who presents with a recurrent scalp cyst.  Risks and complications were reviewed and included bleeding, pain, scar, and risk of anesthesia. Consent was signed and confirmed and she wished to proceed.  DESCRIPTION OF PROCEDURE:  The patient was taken to the operating room, placed on the operating room table in supine position.  General anesthesia was administered.  Once adequate, a time-out was called and all information was confirmed to be correct.  She was prepped and draped in the usual sterile fashion.  A 1% lidocaine with epinephrine was injected around the area.  After the time-out, the 15 blade was used to make an elliptical incision around the cyst in order to excise it completely.  The cyst was quite deep through the scalp, but not into the pericranium, that covering was left intact, 1 cm was the size.  It was irrigated with the lidocaine and the deep layer was closed with a 5-0 Monocryl.  Vertical mattress sutures were then used to reapproximate the skin edges.  The patient tolerated the procedure well.  She was allowed to wake up, extubated, and taken to recovery room in stable condition.     Claire Sanger, DO     CS/MEDQ  D:  04/26/2012  T:  04/26/2012  Job:  [id number] 

## 2012-04-26 NOTE — H&P (View-Only) (Signed)
This document contains confidential information from a [facility] medical record system and may be unauthenticated. Release may be made only with a valid authorization or in accordance with applicable policies of Medical Center or its affiliates. This document must be maintained in a secure manner or discarded/destroyed as required by Medical Center policy or by a confidential means such as shredding.   Katherine Hurst   04/18/2012 8:45 AM Office Visit  MRN: [id number]  Department: [facility]  Dept Phone: [phone]  Description: Female DOB: Feb 21, 1971  Provider: Fanny Bien Rayburn, PA-C   Diagnoses  -  Scalp cyst   - Primary   706.2      Dx: Sebaceous cyst [706.2]   Vitals - Last Recorded     129/83  76  98.5 F (36.9 C) (Oral)          Subjective:    Patient ID: Katherine Hurst is a 42 y.o. female.  HPI The patient is a 42 yrs old bf here with family for history and physical for excision of a scalp cyst.  She has been dealing with the area for a year.  It fills, drains and then heals to only open again a few weeks later.  She had the area excised by Dr. Danella Deis but unfortunately it has filled back up.  The area is slightly tender but nothing is draining at the moment. There is no sign of infection.  The area is slightly fluctuant and a healing skin area from the previous incision.  There are no palpable nodes in the neck.   She is otherwise healthy and desires to have the cyst removed.   The following portions of the patient's history were reviewed and updated as appropriate: allergies, current medications, past family history, past medical history, past social history, past surgical history and problem list.  Review of Systems  Constitutional: Negative.   HENT: Negative.   Eyes: Negative.   Respiratory: Negative.   Cardiovascular: Negative.   Gastrointestinal: Negative.   Genitourinary: Negative.   Musculoskeletal: Negative.   Neurological:  Negative.   Hematological: Negative.   Psychiatric/Behavioral: Negative.       Objective:    Physical Exam  Constitutional: She is oriented to person, place, and time. She appears well-developed and well-nourished. No distress.  HENT:   Head: Normocephalic and atraumatic.   Nose: Nose normal.   Mouth/Throat: Oropharynx is clear and moist.       Left parietal scalp cyst is palpable, non tender, and immobile. There is some widened scar where the lesion was excised previously  Eyes: EOM are normal. Pupils are equal, round, and reactive to light.  Neck: Normal range of motion. Neck supple. No JVD present. No tracheal deviation present.  Cardiovascular: Normal rate, regular rhythm, normal heart sounds and intact distal pulses.   Pulmonary/Chest: Effort normal. No stridor. No respiratory distress. She has no wheezes. She exhibits no tenderness.  Abdominal: Soft. She exhibits no distension and no mass. There is no tenderness. There is no guarding.  Musculoskeletal: Normal range of motion.  Lymphadenopathy:    She has no cervical adenopathy.  Neurological: She is alert and oriented to person, place, and time.  Skin: Skin is warm and dry.  Psychiatric: She has a normal mood and affect. Her behavior is normal. Judgment and thought content normal.     Assessment:   1.  Scalp cyst        Plan:   Plan cyst excision in the  OR. The procedure, possible risks and benefits were discussed with the patient and questions answered and she wishes to proceed. Consent was obtained.

## 2012-04-26 NOTE — Anesthesia Procedure Notes (Signed)
Procedure Name: LMA Insertion Date/Time: 04/26/2012 9:49 AM Performed by: Zenia Resides D Pre-anesthesia Checklist: Patient identified, Emergency Drugs available, Suction available and Patient being monitored Patient Re-evaluated:Patient Re-evaluated prior to inductionOxygen Delivery Method: Circle System Utilized Preoxygenation: Pre-oxygenation with 100% oxygen Intubation Type: IV induction Ventilation: Mask ventilation without difficulty LMA: LMA inserted LMA Size: 4.0 Number of attempts: 1 Airway Equipment and Method: bite block Placement Confirmation: positive ETCO2 Tube secured with: Tape Dental Injury: Teeth and Oropharynx as per pre-operative assessment

## 2012-04-26 NOTE — Op Note (Deleted)
NAMEMarland Kitchen  AMRUTHA, AVERA                 ACCOUNT NO.:  [id number]  MEDICAL RECORD NO.:  [id number]  LOCATION:                               FACILITY:  [facility]  PHYSICIAN:  Wayland Denis, DO      DATE OF BIRTH:  [birth year]  DATE OF PROCEDURE:  04/26/2012 DATE OF DISCHARGE:  04/26/2012                              OPERATIVE REPORT   PREOPERATIVE DIAGNOSIS:  Scalp cyst.  POSTOPERATIVE DIAGNOSIS:  Scalp cyst.  PROCEDURE:  Excision of scalp cyst 1 cm.  SURGEON:  Wayland Denis, DO  ASSISTANT:  Harrie Foreman, LPN.  ANESTHESIA:  General.  INDICATION FOR PROCEDURE:  The patient is a 42 year old female who presents with a recurrent scalp cyst.  Risks and complications were reviewed and included bleeding, pain, scar, and risk of anesthesia. Consent was signed and confirmed and she wished to proceed.  DESCRIPTION OF PROCEDURE:  The patient was taken to the operating room, placed on the operating room table in supine position.  General anesthesia was administered.  Once adequate, a time-out was called and all information was confirmed to be correct.  She was prepped and draped in the usual sterile fashion.  A 1% lidocaine with epinephrine was injected around the area.  After the time-out, the 15 blade was used to make an elliptical incision around the cyst in order to excise it completely.  The cyst was quite deep through the scalp, but not into the pericranium, that covering was left intact, 1 cm was the size.  It was irrigated with the lidocaine and the deep layer was closed with a 5-0 Monocryl.  Vertical mattress sutures were then used to reapproximate the skin edges.  The patient tolerated the procedure well.  She was allowed to wake up, extubated, and taken to recovery room in stable condition.     Wayland Denis, DO     CS/MEDQ  D:  04/26/2012  T:  04/26/2012  Job:  [id number]

## 2012-04-26 NOTE — Transfer of Care (Signed)
Immediate Anesthesia Transfer of Care Note  Patient: Katherine Hurst  Procedure(s) Performed: Procedure(s): EXCISION OF SCALP CYST (N/A)  Patient Location: PACU  Anesthesia Type:General  Level of Consciousness: awake, alert  and oriented  Airway & Oxygen Therapy: Patient Spontanous Breathing and Patient connected to face mask oxygen  Post-op Assessment: Report given to PACU RN and Post -op Vital signs reviewed and stable  Post vital signs: Reviewed and stable  Complications: No apparent anesthesia complications

## 2012-04-27 ENCOUNTER — Encounter (HOSPITAL_BASED_OUTPATIENT_CLINIC_OR_DEPARTMENT_OTHER): Payer: Self-pay | Admitting: Plastic Surgery

## 2012-10-12 ENCOUNTER — Encounter (HOSPITAL_COMMUNITY): Payer: Self-pay | Admitting: Emergency Medicine

## 2012-10-12 ENCOUNTER — Emergency Department (HOSPITAL_COMMUNITY)
Admission: EM | Admit: 2012-10-12 | Discharge: 2012-10-12 | Disposition: A | Discharge status: Home / Self Care | Payer: BC Managed Care – PPO | Attending: Emergency Medicine | Admitting: Emergency Medicine

## 2012-10-12 ENCOUNTER — Emergency Department (HOSPITAL_COMMUNITY): Payer: BC Managed Care – PPO

## 2012-10-12 DIAGNOSIS — S93609A Unspecified sprain of unspecified foot, initial encounter: Secondary | ICD-10-CM | POA: Insufficient documentation

## 2012-10-12 DIAGNOSIS — S93602A Unspecified sprain of left foot, initial encounter: Secondary | ICD-10-CM

## 2012-10-12 DIAGNOSIS — Y9389 Activity, other specified: Secondary | ICD-10-CM | POA: Insufficient documentation

## 2012-10-12 DIAGNOSIS — Y929 Unspecified place or not applicable: Secondary | ICD-10-CM | POA: Insufficient documentation

## 2012-10-12 DIAGNOSIS — X500XXA Overexertion from strenuous movement or load, initial encounter: Secondary | ICD-10-CM | POA: Insufficient documentation

## 2012-10-12 MED ORDER — HYDROCODONE-ACETAMINOPHEN 5-325 MG PO TABS: ORAL_TABLET | ORAL | Status: DC

## 2012-10-12 MED ORDER — HYDROCODONE-ACETAMINOPHEN 5-325 MG PO TABS
2.0000 | ORAL_TABLET | Freq: Once | ORAL | Status: AC
  Administered 2012-10-12: 2 via ORAL
  Filled 2012-10-12: qty 2

## 2012-10-12 MED ORDER — IBUPROFEN 800 MG PO TABS
800.0000 mg | ORAL_TABLET | Freq: Once | ORAL | Status: AC
  Administered 2012-10-12: 800 mg via ORAL
  Filled 2012-10-12: qty 1

## 2012-10-12 NOTE — ED Provider Notes (Signed)
Medical screening examination/treatment/procedure(s) were performed by non-physician practitioner and as supervising physician I was immediately available for consultation/collaboration.  John-Adam Arvine Clayburn, M.D.     John-Adam Terena Bohan, MD 10/12/12 0248 

## 2012-10-12 NOTE — ED Provider Notes (Signed)
CSN: [id number]     Arrival date & time 10/12/12  0055 History     First MD Initiated Contact with Patient 10/12/12 0123     Chief Complaint  Patient presents with  . Foot Injury   (Consider location/radiation/quality/duration/timing/severity/associated sxs/prior Treatment) HPI Pt is a 42yo female c/o sudden onset left foot pain and swelling after tripping over a step, twisting her foot.  Pt states she was able to catch herself before she fell to the ground.  Denies head trauma, LOC, or any other injury.  Pain is constant, aching and throbbing, 7/10, unable to bear weight due to pain.  Denies previous injury to same foot.    History reviewed. No pertinent past medical history. Past Surgical History  Procedure Laterality Date  . Cesarean section    . Tubal ligation      13 years ago  . Ear cyst excision N/A 04/26/2012    Procedure: EXCISION OF SCALP CYST;  Surgeon: Wayland Denis, DO;  Location: [facility];  Service: Plastics;  Laterality: N/A;   Family History  Problem Relation Age of Onset  . Hypertension Mother   . Hypertension Father   . Asthma Sister    History  Substance Use Topics  . Smoking status: Never Smoker   . Smokeless tobacco: Never Used  . Alcohol Use: No   OB History   Grav Para Term Preterm Abortions TAB SAB Ect Mult Living                 Review of Systems  Musculoskeletal: Positive for myalgias, joint swelling and arthralgias.       Left foot   Skin: Negative for wound.  All other systems reviewed and are negative.    Allergies  Review of patient's allergies indicates no known allergies.  Home Medications   Current Outpatient Rx  Name  Route  Sig  Dispense  Refill  . ibuprofen (ADVIL,MOTRIN) 200 MG tablet   Oral   Take 600 mg by mouth every 6 (six) hours as needed for pain.         Marland Kitchen HYDROcodone-acetaminophen (NORCO/VICODIN) 5-325 MG per tablet      Take 1-2 pills every 4-6 hours as needed for pain.   6 tablet   0     BP 157/82  Pulse 98  Temp(Src) 99.6 F (37.6 C) (Oral)  Resp 20  Ht 5\' 5"  (1.651 m)  Wt 260 lb (117.935 kg)  BMI 43.27 kg/m2  SpO2 97%  LMP 09/28/2012 Physical Exam  Nursing note and vitals reviewed. Constitutional: She is oriented to person, place, and time. She appears well-developed and well-nourished.  HENT:  Head: Normocephalic and atraumatic.  Eyes: EOM are normal.  Neck: Normal range of motion.  Cardiovascular: Normal rate.   Pulmonary/Chest: Effort normal.  Musculoskeletal: Normal range of motion. She exhibits edema and tenderness.  Moderate edema to dorsum of left foot.  No deformity.  Skin in tact. Neurovascularly intact.  Pain with plantarflexion and dorsiflexion.  Unable to bear weight on left foot.   Neurological: She is alert and oriented to person, place, and time.  Skin: Skin is warm and dry. No rash noted. No erythema.  Psychiatric: She has a normal mood and affect. Her behavior is normal.    ED Course   Procedures (including critical care time)  Labs Reviewed - No data to display Dg Foot Complete Left  10/12/2012   *RADIOLOGY REPORT*  Clinical Data: Injury left foot with bruising about the  third through fifth metatarsals.  LEFT FOOT - COMPLETE 3+ VIEW  Comparison: None.  Findings: Soft tissues about the dorsum of the foot appear swollen. No fracture or dislocation is identified.  Enthesopathic change Achilles tendon noted.  IMPRESSION: Soft tissue swelling without underlying acute bony or joint abnormality.   Original Report Authenticated By: Holley Dexter, M.D.   1. Foot sprain, left, initial encounter     MDM  Moderate edema to dorsum of left foot, TTP.  No underlying acute bony or joint abnormality.  Will place pt in post-op shoe.    Rx: norco, post-op shoe and crutches.  F/u with [facility] in 1-2 weeks for continued foot pain. Encouraged to gradually increase weight on left foot and perform gentle stretches as swelling and pain improves  to aid in healing.    Junius Finner, PA-C 10/12/12 [phone]

## 2012-10-12 NOTE — ED Notes (Signed)
Pt states that she tripped over a step and injured her left foot. Foot is swollen and bruised.

## 2016-01-07 ENCOUNTER — Encounter (HOSPITAL_COMMUNITY): Payer: Self-pay | Admitting: Emergency Medicine

## 2016-01-07 ENCOUNTER — Observation Stay (HOSPITAL_COMMUNITY)
Admission: EM | Admit: 2016-01-07 | Discharge: 2016-01-08 | Disposition: A | Discharge status: Home / Self Care | Payer: BLUE CROSS/BLUE SHIELD | Attending: Internal Medicine | Admitting: Internal Medicine

## 2016-01-07 ENCOUNTER — Emergency Department (HOSPITAL_COMMUNITY): Payer: BLUE CROSS/BLUE SHIELD

## 2016-01-07 DIAGNOSIS — Z6839 Body mass index (BMI) 39.0-39.9, adult: Secondary | ICD-10-CM | POA: Diagnosis not present

## 2016-01-07 DIAGNOSIS — K5792 Diverticulitis of intestine, part unspecified, without perforation or abscess without bleeding: Secondary | ICD-10-CM | POA: Diagnosis present

## 2016-01-07 DIAGNOSIS — E669 Obesity, unspecified: Secondary | ICD-10-CM | POA: Diagnosis not present

## 2016-01-07 DIAGNOSIS — K5732 Diverticulitis of large intestine without perforation or abscess without bleeding: Principal | ICD-10-CM | POA: Insufficient documentation

## 2016-01-07 DIAGNOSIS — E876 Hypokalemia: Secondary | ICD-10-CM | POA: Diagnosis not present

## 2016-01-07 DIAGNOSIS — K5720 Diverticulitis of large intestine with perforation and abscess without bleeding: Secondary | ICD-10-CM

## 2016-01-07 DIAGNOSIS — R030 Elevated blood-pressure reading, without diagnosis of hypertension: Secondary | ICD-10-CM

## 2016-01-07 DIAGNOSIS — R1032 Left lower quadrant pain: Secondary | ICD-10-CM | POA: Diagnosis present

## 2016-01-07 LAB — URINE MICROSCOPIC-ADD ON

## 2016-01-07 LAB — URINALYSIS, ROUTINE W REFLEX MICROSCOPIC
Bilirubin Urine: NEGATIVE
Glucose, UA: NEGATIVE mg/dL
HGB URINE DIPSTICK: NEGATIVE
KETONES UR: NEGATIVE mg/dL
Nitrite: NEGATIVE
PROTEIN: NEGATIVE mg/dL
Specific Gravity, Urine: 1.016 (ref 1.005–1.030)
pH: 6 (ref 5.0–8.0)

## 2016-01-07 LAB — COMPREHENSIVE METABOLIC PANEL
ALK PHOS: 61 U/L (ref 38–126)
ALT: 12 U/L — AB (ref 14–54)
AST: 15 U/L (ref 15–41)
Albumin: 4.2 g/dL (ref 3.5–5.0)
Anion gap: 7 (ref 5–15)
BUN: 15 mg/dL (ref 6–20)
CALCIUM: 9.3 mg/dL (ref 8.9–10.3)
CO2: 27 mmol/L (ref 22–32)
CREATININE: 0.94 mg/dL (ref 0.44–1.00)
Chloride: 102 mmol/L (ref 101–111)
Glucose, Bld: 106 mg/dL — ABNORMAL HIGH (ref 65–99)
Potassium: 3.3 mmol/L — ABNORMAL LOW (ref 3.5–5.1)
Sodium: 136 mmol/L (ref 135–145)
Total Bilirubin: 0.3 mg/dL (ref 0.3–1.2)
Total Protein: 8.5 g/dL — ABNORMAL HIGH (ref 6.5–8.1)

## 2016-01-07 LAB — CBC
HCT: 32.8 % — ABNORMAL LOW (ref 36.0–46.0)
Hemoglobin: 10.5 g/dL — ABNORMAL LOW (ref 12.0–15.0)
MCH: 27 pg (ref 26.0–34.0)
MCHC: 32 g/dL (ref 30.0–36.0)
MCV: 84.3 fL (ref 78.0–100.0)
PLATELETS: 292 10*3/uL (ref 150–400)
RBC: 3.89 MIL/uL (ref 3.87–5.11)
RDW: 13.5 % (ref 11.5–15.5)
WBC: 8.2 10*3/uL (ref 4.0–10.5)

## 2016-01-07 LAB — LIPASE, BLOOD: Lipase: 27 U/L (ref 11–51)

## 2016-01-07 LAB — I-STAT BETA HCG BLOOD, ED (MC, WL, AP ONLY)

## 2016-01-07 LAB — I-STAT CG4 LACTIC ACID, ED: Lactic Acid, Venous: 0.66 mmol/L (ref 0.5–1.9)

## 2016-01-07 MED ORDER — SODIUM CHLORIDE 0.9 % IV BOLUS (SEPSIS)
1000.0000 mL | Freq: Once | INTRAVENOUS | Status: AC
  Administered 2016-01-07: 1000 mL via INTRAVENOUS

## 2016-01-07 MED ORDER — POTASSIUM CHLORIDE CRYS ER 20 MEQ PO TBCR
20.0000 meq | EXTENDED_RELEASE_TABLET | Freq: Once | ORAL | Status: AC
  Administered 2016-01-07: 20 meq via ORAL
  Filled 2016-01-07: qty 1

## 2016-01-07 MED ORDER — MORPHINE SULFATE (PF) 2 MG/ML IV SOLN
4.0000 mg | Freq: Once | INTRAVENOUS | Status: AC
  Administered 2016-01-07: 4 mg via INTRAVENOUS
  Filled 2016-01-07: qty 2

## 2016-01-07 MED ORDER — ONDANSETRON HCL 4 MG/2ML IJ SOLN
4.0000 mg | Freq: Four times a day (QID) | INTRAMUSCULAR | Status: DC | PRN
  Administered 2016-01-07: 4 mg via INTRAVENOUS
  Filled 2016-01-07: qty 2

## 2016-01-07 MED ORDER — METRONIDAZOLE IN NACL 5-0.79 MG/ML-% IV SOLN
500.0000 mg | Freq: Three times a day (TID) | INTRAVENOUS | Status: DC
  Administered 2016-01-07 – 2016-01-08 (×3): 500 mg via INTRAVENOUS
  Filled 2016-01-07 (×3): qty 100

## 2016-01-07 MED ORDER — SODIUM CHLORIDE 0.9 % IV SOLN
INTRAVENOUS | Status: DC
  Administered 2016-01-07 – 2016-01-08 (×2): via INTRAVENOUS

## 2016-01-07 MED ORDER — IOPAMIDOL (ISOVUE-300) INJECTION 61%
100.0000 mL | Freq: Once | INTRAVENOUS | Status: AC | PRN
  Administered 2016-01-07: 100 mL via INTRAVENOUS

## 2016-01-07 MED ORDER — CIPROFLOXACIN IN D5W 400 MG/200ML IV SOLN
400.0000 mg | Freq: Two times a day (BID) | INTRAVENOUS | Status: DC
  Administered 2016-01-07 – 2016-01-08 (×3): 400 mg via INTRAVENOUS
  Filled 2016-01-07 (×3): qty 200

## 2016-01-07 MED ORDER — ONDANSETRON HCL 4 MG PO TABS
4.0000 mg | ORAL_TABLET | Freq: Four times a day (QID) | ORAL | Status: DC | PRN
  Administered 2016-01-08: 4 mg via ORAL
  Filled 2016-01-07: qty 1

## 2016-01-07 MED ORDER — PIPERACILLIN-TAZOBACTAM 3.375 G IVPB 30 MIN
3.3750 g | Freq: Once | INTRAVENOUS | Status: AC
  Administered 2016-01-07: 3.375 g via INTRAVENOUS
  Filled 2016-01-07: qty 50

## 2016-01-07 MED ORDER — ENOXAPARIN SODIUM 40 MG/0.4ML ~~LOC~~ SOLN
40.0000 mg | SUBCUTANEOUS | Status: DC
  Administered 2016-01-07: 40 mg via SUBCUTANEOUS
  Filled 2016-01-07: qty 0.4

## 2016-01-07 NOTE — H&P (Signed)
History and Physical  Katherine Hurst [id number] DOB: 25-Nov-1970 DOA: 01/07/2016  Referring physician: Antony Blackbird, ER physician   PCP: No PCP Per Patient   Patient coming from: Home  & is able to ambulate without assistance   Chief Complaint: Abdominal pain   HPI: Katherine Hurst is a 45 y.o. female with no past medical history, not on any medications who 2 days ago started having left lower quadrant abdominal pain. Initially felt to be intermittent, but then started occurring with more frequency and then continuous patient described pain in the left lower quadrant tenderness with deep palpation and she felt even worse whenever she had to move her bowels. Pain did not radiate elsewhere. Some nausea although no associated vomiting. She decided to come to the emergency room for further evaluation.  ED Course: In emergency, patient was noted to have a normal white count and no fever. Rest of her labs are unremarkable. A CT scan of the abdomen and pelvis noted left lower quadrant diverticulitis as well as diverticulosis and extensive part of her colon. Is also questionable area of microperforation in the sigmoid area although is no signs of any abscess. Hospitalists were called for evaluation after emergency room discussed with patient and given that concerns for microperforation and that she had no outpatient PCP follow-up, it was felt best that she come in for observation  Review of Systems: Patient seen after arrival to floor . Pt complains of mild left lower quadrant discomfort   Pt denies any headaches, vision changes, dysphagia, chest pain, palpitations, short of breath, wheeze, cough, hematuria, dysuria, constipation, diarrhea, focal extremity numbness weakness or pain .  Review of systems are otherwise negative   History reviewed. No pertinent past medical history. Past Surgical History:  Procedure Laterality Date  . CESAREAN SECTION    . EAR CYST EXCISION N/A 04/26/2012   Procedure:  EXCISION OF SCALP CYST;  Surgeon: Theodoro Kos, DO;  Location: [place];  Service: Plastics;  Laterality: N/A;  . TUBAL LIGATION     13 years ago    Social History:  reports that she has never smoked. She has never used smokeless tobacco. She reports that she does not drink alcohol or use drugs.   No Known Allergies  Family History  Problem Relation Age of Onset  . Hypertension Mother   . Hypertension Father   . Asthma Sister       Prior to Admission medications   Medication Sig Start Date End Date Taking? Authorizing Provider  Cranberry-Vitamin C (AZO CRANBERRY URINARY TRACT PO) Take 2 tablets by mouth daily as needed. For pain   Yes Historical Provider, MD  HYDROcodone-acetaminophen (NORCO/VICODIN) 5-325 MG per tablet Take 1-2 pills every 4-6 hours as needed for pain. Patient not taking: Reported on 01/07/2016 10/12/12   Noland Fordyce, PA-C    Physical Exam: BP (!) 155/81 (BP Location: Right Arm)   Pulse 84   Temp 98.6 F (37 C) (Oral)   Resp 16   Ht 5\' 5"  (1.651 m)   Wt 107.5 kg (237 lb)   LMP 12/24/2015 Comment: neg preg test 01/07/2016  SpO2 100%   BMI 39.44 kg/m   General:  Alert and oriented 3, no acute distress  Eyes: Sclera nonicteric, check lipid movements are intact  ENT: Normocephalic, atraumatic, mucous membranes are slightly dry  Neck: Supple, no JVD  Cardiovascular: Regular rate and rhythm, S1-S2  Respiratory: = Clear to auscultation bilaterally  Abdomen: Soft, obese, mild tenderness left  lower quadrant, hypoactive bowel sounds  Skin: No skin breaks, tears or lesions  Musculoskeletal: No clubbing or cyanosis or edema  Psychiatric: Patient is appropriate, no evidence of psychoses  Neurologic: No focal deficits          Labs on Admission:  Basic Metabolic Panel:  Recent Labs Lab 01/07/16 0908  NA 136  K 3.3*  CL 102  CO2 27  GLUCOSE 106*  BUN 15  CREATININE 0.94  CALCIUM 9.3   Liver Function Tests:  Recent Labs Lab  01/07/16 0908  AST 15  ALT 12*  ALKPHOS 61  BILITOT 0.3  PROT 8.5*  ALBUMIN 4.2    Recent Labs Lab 01/07/16 0908  LIPASE 27   No results for input(s): AMMONIA in the last 168 hours. CBC:  Recent Labs Lab 01/07/16 0908  WBC 8.2  HGB 10.5*  HCT 32.8*  MCV 84.3  PLT 292    Radiological Exams on Admission: Ct Abdomen Pelvis W Contrast  Result Date: 01/07/2016 CLINICAL DATA:  Two day history of left lower quadrant pain EXAM: CT ABDOMEN AND PELVIS WITH CONTRAST TECHNIQUE: Multidetector CT imaging of the abdomen and pelvis was performed using the standard protocol following bolus administration of intravenous contrast. CONTRAST:  149mL ISOVUE-300 IOPAMIDOL (ISOVUE-300) INJECTION 61% COMPARISON:  None. FINDINGS: Lower chest: There is mild bibasilar lung atelectasis. Lung bases otherwise are clear. Hepatobiliary: No focal liver lesions are evident. There are gallstones in the gallbladder. There is equivocal gallbladder wall thickening. There is no appreciable biliary duct dilatation. Pancreas: No pancreatic mass or inflammatory focus. Spleen: No splenic lesions are evident. Adrenals/Urinary Tract: Adrenals appear normal bilaterally. Kidneys bilaterally show no evidence of mass or hydronephrosis on either side. There is no renal or ureteral calculus on either side. Urinary bladder is midline with wall thickness within normal limits. Stomach/Bowel: There is sigmoid diverticulitis. There is extensive mesenteric thickening in the proximal to mid sigmoid colon. Several irregular diverticula are noted in this area. There is a tiny focus of air within this mesenteric inflammation, suggesting microperforation. There is a focal area of decreased attenuation within this area of diverticulitis measuring 1 x 1 cm may represent the earliest changes of a developing phlegmon in this area. A well-defined abscess is not demonstrated currently in this area of diverticulitis. Elsewhere, there are diverticula  scattered throughout the colon. There is no evident bowel obstruction. There is no free air beyond questionable minimal microperforation in the sigmoid colon diverticulitis region. There is no portal venous air. Vascular/Lymphatic: There is no abdominal aortic aneurysm. No vascular lesions are evident on this study. There is no adenopathy appreciable in the abdomen or pelvis. Reproductive: Uterus is antegrade. There is a cystic appearing area at the uterus -cervix junction measuring 1.9 x 1.7 cm, a probable nabothian cyst. No other pelvic mass evident. Other: Appendix appears unremarkable. No abscess or ascites evident in the abdomen or pelvis. There is a small ventral hernia containing only fat. Musculoskeletal: There are no blastic or lytic bone lesions. There is no intramuscular or abdominal wall lesion. IMPRESSION: Extensive sigmoid diverticulitis. Suspect early phlegmon formation within this area of diverticulitis measuring approximately 1 x 1 cm. A well-defined abscess is not seen currently. There appears to be a minimal focus of microperforation within the thickened mesenteric in the proximal to mid sigmoid region. No other free air evident in the abdomen or pelvis. No bowel obstruction. No abscess elsewhere in the abdomen or pelvis. Appendix appears normal. There is cholelithiasis with equivocal gallbladder wall  thickening. Probable rather prominent cervical nabothian cyst in the cervix -lower uterine segment junction. There is a small ventral hernia containing only fat. Electronically Signed   By: Lowella Grip III M.D.   On: 01/07/2016 11:36    EKG: Not done  Assessment/Plan Present on Admission: . Acute diverticulitis: Given normal white count and lack of fever and no abscess seen on CT, we'll try clear liquids plus IV antibiotics at first and then change over to by mouth. He is able tolerate by mouth antibiotics plus food, can likely discharge home tomorrow. 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 I tried to give her some counseling on low residue diet. I also advised her that she does recurrent attacks at such a young age, she may benefit from partial colectomy. Prior to discharge, we will try to set her up with a new PCP who can follow-up with her in a few weeks.  . Obesity (BMI 30-39.9): Patient meets criteria with BMI greater than 30  . Hypokalemia: Secondary to some nausea and vomiting. Replaced.  Elevated blood pressures: Some of this may be in the setting of pain. If pressures stay elevated, patient will need follow-up with a new PCP for establishing hypertension as diagnosis  DVT prophylaxis: Lovenox   Code Status: Full code   Family Communication: Mom and sister at the bedside   Disposition Plan: The patient tolerating by mouth antibiotics and clear/full liquids, likely can go home tomorrow   Consults called: None   Admission status: Given suspicion the patient likely will be able to go home tomorrow, we'll place her under observation     Annita Brod MD [facility] Pager [phone]  If 7PM-7AM, please contact night-coverage [email or web address] Password [facility]  01/07/2016, 6:24 PM

## 2016-01-07 NOTE — ED Triage Notes (Signed)
Patient reports left lower abdominal pain x2 days. Patient reports only small bowel movements over the past 2 days. Denies nausea, vomiting, diarrhea, dysuria.

## 2016-01-07 NOTE — ED Provider Notes (Signed)
[facility] DEPT Provider Note   CSN: [id number] Arrival date & time: 01/07/16  [id number]     History   Chief Complaint Chief Complaint  Patient presents with  . Abdominal Pain    HPI Katherine Hurst is a 45 y.o. female with no significant past medical history who presents with left lower quadrant and left flank abdominal pain. Patient reports that her mother was recently diagnosed with diverticulitis and that is one thing patient is concerned about today. Patient describes the last 2 days, she has had cramping and pressure-like abdominal pain on the left side of her abdomen. She says that she has had bowel movements but they have been smaller and thinner in caliber. She denies any bleeding. She denies any vaginal discharge or vaginal bleeding and says her last mental cycle was 2 weeks ago. She has had tubal ligation previously. She reports no dysuria but reports abdominal pain with urination. She says there has been darker for the last 2 days. She denies a history of kidney stones to her knowledge. She denies fevers, chills, chest pain, shortness of breath, nausea, vomiting or abdominal trauma. She rates her abdominal pain as a 6 out of 10 severity and intermittent. She reports bowel movements make the pain worse. She has not taken anything for her discomfort. She denies any pelvic pain.  The history is provided by the patient and medical records. No language interpreter was used.  Abdominal Pain   This is a new problem. The current episode started 2 days ago. The problem occurs constantly. The problem has not changed since onset.Associated with: worse pain with BM and urination. The pain is located in the LLQ. The quality of the pain is pressure-like and cramping. The pain is at a severity of 6/10. The pain is moderate. Associated symptoms include constipation. Pertinent negatives include fever, diarrhea, nausea, vomiting, dysuria, frequency, hematuria and headaches. The symptoms are aggravated by  bowel movements, urination and palpation. Nothing relieves the symptoms. Her past medical history does not include PUD, ulcerative colitis or Crohn's disease.    History reviewed. No pertinent past medical history.  Patient Active Problem List   Diagnosis Date Noted  . Cyst of skin 04/26/2012    Past Surgical History:  Procedure Laterality Date  . CESAREAN SECTION    . EAR CYST EXCISION N/A 04/26/2012   Procedure: EXCISION OF SCALP CYST;  Surgeon: Theodoro Kos, DO;  Location: [facility];  Service: Plastics;  Laterality: N/A;  . TUBAL LIGATION     13 years ago    OB History    No data available       Home Medications    Prior to Admission medications   Medication Sig Start Date End Date Taking? Authorizing Provider  HYDROcodone-acetaminophen (NORCO/VICODIN) 5-325 MG per tablet Take 1-2 pills every 4-6 hours as needed for pain. 10/12/12   Noland Fordyce, PA-C  ibuprofen (ADVIL,MOTRIN) 200 MG tablet Take 600 mg by mouth every 6 (six) hours as needed for pain.    Historical Provider, MD    Family History Family History  Problem Relation Age of Onset  . Hypertension Mother   . Hypertension Father   . Asthma Sister     Social History Social History  Substance Use Topics  . Smoking status: Never Smoker  . Smokeless tobacco: Never Used  . Alcohol use No     Allergies   Review of patient's allergies indicates no known allergies.   Review of Systems Review of Systems  Constitutional: Negative for activity change, chills, diaphoresis, fatigue and fever.  HENT: Negative for congestion and rhinorrhea.   Eyes: Negative for visual disturbance.  Respiratory: Negative for cough, chest tightness, shortness of breath and stridor.   Cardiovascular: Negative for chest pain, palpitations and leg swelling.  Gastrointestinal: Positive for abdominal pain and constipation. Negative for abdominal distention, blood in stool, diarrhea, nausea and vomiting.  Genitourinary:  Negative for difficulty urinating, dysuria, flank pain, frequency, hematuria, menstrual problem, pelvic pain, vaginal bleeding and vaginal discharge.  Musculoskeletal: Negative for back pain, neck pain and neck stiffness.  Skin: Negative for rash and wound.  Neurological: Negative for dizziness, weakness, light-headedness, numbness and headaches.  Psychiatric/Behavioral: Negative for agitation and confusion.  All other systems reviewed and are negative.    Physical Exam Updated Vital Signs BP 161/87 (BP Location: Right Arm)   Pulse 96   Temp 98.3 F (36.8 C) (Oral)   Resp 20   Ht 5\' 5"  (1.651 m)   Wt 237 lb (107.5 kg)   LMP 12/24/2015   SpO2 98%   BMI 39.44 kg/m   Physical Exam  Constitutional: She appears well-developed and well-nourished. No distress.  HENT:  Head: Normocephalic and atraumatic.  Mouth/Throat: Oropharynx is clear and moist. No oropharyngeal exudate.  Eyes: Conjunctivae and EOM are normal. Pupils are equal, round, and reactive to light.  Neck: Normal range of motion. Neck supple.  Cardiovascular: Normal rate and regular rhythm.   No murmur heard. Pulmonary/Chest: Effort normal and breath sounds normal. No stridor. No respiratory distress. She has no wheezes. She exhibits no tenderness.  Abdominal: Soft. Normal appearance. There is tenderness in the left lower quadrant. There is no rigidity, no rebound and no CVA tenderness.    Musculoskeletal: She exhibits no edema or tenderness.  Neurological: She is alert. She exhibits normal muscle tone.  Skin: Skin is warm and dry. Capillary refill takes less than 2 seconds.  Psychiatric: She has a normal mood and affect.  Nursing note and vitals reviewed.    ED Treatments / Results  Labs (all labs ordered are listed, but only abnormal results are displayed) Labs Reviewed  COMPREHENSIVE METABOLIC PANEL - Abnormal; Notable for the following:       Result Value   Potassium 3.3 (*)    Glucose, Bld 106 (*)    Total  Protein 8.5 (*)    ALT 12 (*)    All other components within normal limits  CBC - Abnormal; Notable for the following:    Hemoglobin 10.5 (*)    HCT 32.8 (*)    All other components within normal limits  URINALYSIS, ROUTINE W REFLEX MICROSCOPIC (NOT AT [facility]) - Abnormal; Notable for the following:    APPearance CLOUDY (*)    Leukocytes, UA SMALL (*)    All other components within normal limits  URINE MICROSCOPIC-ADD ON - Abnormal; Notable for the following:    Squamous Epithelial / LPF 0-5 (*)    Bacteria, UA MANY (*)    All other components within normal limits  LIPASE, BLOOD  I-STAT BETA HCG BLOOD, ED ([facility], [facility], [facility] ONLY)  I-STAT CG4 LACTIC ACID, ED  POC URINE PREG, ED    EKG  EKG Interpretation None       Radiology Ct Abdomen Pelvis W Contrast  Result Date: 01/07/2016 CLINICAL DATA:  Two day history of left lower quadrant pain EXAM: CT ABDOMEN AND PELVIS WITH CONTRAST TECHNIQUE: Multidetector CT imaging of the abdomen and pelvis was performed using the standard  protocol following bolus administration of intravenous contrast. CONTRAST:  110mL ISOVUE-300 IOPAMIDOL (ISOVUE-300) INJECTION 61% COMPARISON:  None. FINDINGS: Lower chest: There is mild bibasilar lung atelectasis. Lung bases otherwise are clear. Hepatobiliary: No focal liver lesions are evident. There are gallstones in the gallbladder. There is equivocal gallbladder wall thickening. There is no appreciable biliary duct dilatation. Pancreas: No pancreatic mass or inflammatory focus. Spleen: No splenic lesions are evident. Adrenals/Urinary Tract: Adrenals appear normal bilaterally. Kidneys bilaterally show no evidence of mass or hydronephrosis on either side. There is no renal or ureteral calculus on either side. Urinary bladder is midline with wall thickness within normal limits. Stomach/Bowel: There is sigmoid diverticulitis. There is extensive mesenteric thickening in the proximal to mid sigmoid colon. Several irregular  diverticula are noted in this area. There is a tiny focus of air within this mesenteric inflammation, suggesting microperforation. There is a focal area of decreased attenuation within this area of diverticulitis measuring 1 x 1 cm may represent the earliest changes of a developing phlegmon in this area. A well-defined abscess is not demonstrated currently in this area of diverticulitis. Elsewhere, there are diverticula scattered throughout the colon. There is no evident bowel obstruction. There is no free air beyond questionable minimal microperforation in the sigmoid colon diverticulitis region. There is no portal venous air. Vascular/Lymphatic: There is no abdominal aortic aneurysm. No vascular lesions are evident on this study. There is no adenopathy appreciable in the abdomen or pelvis. Reproductive: Uterus is antegrade. There is a cystic appearing area at the uterus -cervix junction measuring 1.9 x 1.7 cm, a probable nabothian cyst. No other pelvic mass evident. Other: Appendix appears unremarkable. No abscess or ascites evident in the abdomen or pelvis. There is a small ventral hernia containing only fat. Musculoskeletal: There are no blastic or lytic bone lesions. There is no intramuscular or abdominal wall lesion. IMPRESSION: Extensive sigmoid diverticulitis. Suspect early phlegmon formation within this area of diverticulitis measuring approximately 1 x 1 cm. A well-defined abscess is not seen currently. There appears to be a minimal focus of microperforation within the thickened mesenteric in the proximal to mid sigmoid region. No other free air evident in the abdomen or pelvis. No bowel obstruction. No abscess elsewhere in the abdomen or pelvis. Appendix appears normal. There is cholelithiasis with equivocal gallbladder wall thickening. Probable rather prominent cervical nabothian cyst in the cervix -lower uterine segment junction. There is a small ventral hernia containing only fat. Electronically Signed    By: Lowella Grip III M.D.   On: 01/07/2016 11:36    Procedures Procedures (including critical care time)  Medications Ordered in ED Medications  morphine 2 MG/ML injection 4 mg (4 mg Intravenous Given 01/07/16 0922)  sodium chloride 0.9 % bolus 1,000 mL (0 mLs Intravenous Stopped 01/07/16 1152)  iopamidol (ISOVUE-300) 61 % injection 100 mL (100 mLs Intravenous Contrast Given 01/07/16 1109)  piperacillin-tazobactam (ZOSYN) IVPB 3.375 g (0 g Intravenous Stopped 01/07/16 1319)  sodium chloride 0.9 % bolus 1,000 mL (1,000 mLs Intravenous New Bag/Given 01/07/16 1219)     Initial Impression / Assessment and Plan / ED Course  I have reviewed the triage vital signs and the nursing notes.  Pertinent labs & imaging results that were available during my care of the patient were reviewed by me and considered in my medical decision making (see chart for details).  Clinical Course    Katherine Hurst is a 44 y.o. female with no significant past medical history who presents with left lower quadrant  and left flank abdominal pain.  History and exam are seen above.  On exam, patient has left flank and left lower quadrant tenderness. No right lower quadrant tenderness and no frank CVA tenderness. Lungs are clear and neurologic exam is intact. No lower extremity edema.  Patient patient's symptoms, and initialed diagnosis list includes diverticulitis, kidney stone, constipation, and idiopathic abdominal pain. Patient will have laboratory testing, urinalysis, and likely imaging to investigate etiology. Patient given pain medicine and fluids during workup.   Patient reported her symptoms improved after medications. Next  Laboratory testing with evidence of possible UTI. CBC showed no leukocytosis and mild anemia. CMP showed normal kidney function and mild hypokalemia of 3.3. Pregnancy testing negative. Lactic acid nonelevated. Normal lipase.   CT scan shows evidence of acute extensive sigmoid  diverticulitis. There is also minimal focus of microperforation. There is cholelithiasis with equivocal gallbladder wall thickening.  Patient was informed of these findings and her new diagnosis of diverticulitis. Patient given Zosyn for diverticulitis with microperforation. Patient had more fluids ordered and will be admitted to hospitalist service for further management and observation. Patient was offered discharge with oral medications however, due to lack of PCP and concern for microperforation, patient felt more comfortable with plan of admission. Patient admitted in stable condition.    Final Clinical Impressions(s) / ED Diagnoses   Final diagnoses:  Diverticulitis of large intestine with perforation without abscess or bleeding     Clinical Impression: 1. Diverticulitis of large intestine with perforation without abscess or bleeding     Disposition: Admit to Hospitalist service    Courtney Paris, MD 01/07/16 [phone]

## 2016-01-07 NOTE — Progress Notes (Signed)
[facility] ED CM noted pt with coverage but no pcp listed [facility] ED CM spoke with pt on how to obtain an in network pcp with insurance coverage via the customer service number or web site  Cm reviewed ED level of care for crisis/emergent services and community pcp level of care to manage continuous or chronic medical concerns.  The pt voiced understanding CM encouraged pt and discussed pt's responsibility to verify with pt's insurance carrier that any recommended medical provider offered by any emergency room or a hospital provider is within the carrier's network. The pt voiced understanding

## 2016-01-08 DIAGNOSIS — K5792 Diverticulitis of intestine, part unspecified, without perforation or abscess without bleeding: Secondary | ICD-10-CM

## 2016-01-08 LAB — COMPREHENSIVE METABOLIC PANEL
ALBUMIN: 3.3 g/dL — AB (ref 3.5–5.0)
ALK PHOS: 46 U/L (ref 38–126)
ALT: 9 U/L — ABNORMAL LOW (ref 14–54)
ANION GAP: 6 (ref 5–15)
AST: 11 U/L — ABNORMAL LOW (ref 15–41)
BUN: 11 mg/dL (ref 6–20)
CALCIUM: 8.6 mg/dL — AB (ref 8.9–10.3)
CO2: 26 mmol/L (ref 22–32)
Chloride: 106 mmol/L (ref 101–111)
Creatinine, Ser: 0.89 mg/dL (ref 0.44–1.00)
GFR calc Af Amer: >60 mL/min (ref >60)
GFR calc non Af Amer: >60 mL/min (ref >60)
GLUCOSE: 119 mg/dL — AB (ref 65–99)
POTASSIUM: 3.8 mmol/L (ref 3.5–5.1)
SODIUM: 138 mmol/L (ref 135–145)
Total Bilirubin: 0.7 mg/dL (ref 0.3–1.2)
Total Protein: 6.7 g/dL (ref 6.5–8.1)

## 2016-01-08 LAB — CBC
HCT: 29.9 % — ABNORMAL LOW (ref 36.0–46.0)
HEMOGLOBIN: 9.4 g/dL — AB (ref 12.0–15.0)
MCH: 26.7 pg (ref 26.0–34.0)
MCHC: 31.4 g/dL (ref 30.0–36.0)
MCV: 84.9 fL (ref 78.0–100.0)
Platelets: 247 10*3/uL (ref 150–400)
RBC: 3.52 MIL/uL — ABNORMAL LOW (ref 3.87–5.11)
RDW: 13.7 % (ref 11.5–15.5)
WBC: 5.6 10*3/uL (ref 4.0–10.5)

## 2016-01-08 MED ORDER — SENNOSIDES-DOCUSATE SODIUM 8.6-50 MG PO TABS: 1.0000 | ORAL_TABLET | Freq: Every day | ORAL | 0 refills | Status: DC

## 2016-01-08 MED ORDER — POLYETHYLENE GLYCOL 3350 17 G PO PACK: 17.0000 g | PACK | Freq: Every day | ORAL | 0 refills | Status: DC

## 2016-01-08 MED ORDER — CIPROFLOXACIN HCL 500 MG PO TABS: 500.0000 mg | ORAL_TABLET | Freq: Two times a day (BID) | ORAL | 0 refills | Status: AC

## 2016-01-08 MED ORDER — POLYETHYLENE GLYCOL 3350 17 G PO PACK: 17.0000 g | PACK | Freq: Every day | ORAL | Status: DC

## 2016-01-08 MED ORDER — METRONIDAZOLE 500 MG PO TABS: 500.0000 mg | ORAL_TABLET | Freq: Three times a day (TID) | ORAL | 0 refills | Status: AC

## 2016-01-08 MED ORDER — SENNOSIDES-DOCUSATE SODIUM 8.6-50 MG PO TABS: 1.0000 | ORAL_TABLET | Freq: Two times a day (BID) | ORAL | Status: DC

## 2016-01-08 NOTE — Progress Notes (Signed)
discharge instructions discussed with patient until no further questions ask. States feels mildly nauseated but desires to go home --does not want to stay. Prn given. IV discontinued

## 2016-01-08 NOTE — Discharge Summary (Signed)
Discharge Summary  Katherine Hurst [id number] DOB: 02/18/1971  PCP: No PCP Per Patient  Admit date: 01/07/2016 Discharge date: 01/08/2016  Time spent: <67mins  Recommendations for Outpatient Follow-up:  1. F/u with PMD within a week  for hospital discharge follow up, repeat cbc/bmp at follow up  Discharge Diagnoses:  Active Hospital Problems   Diagnosis Date Noted  . Acute diverticulitis 01/07/2016  . Obesity (BMI 30-39.9) 01/07/2016  . Hypokalemia 01/07/2016    Resolved Hospital Problems   Diagnosis Date Noted Date Resolved  No resolved problems to display.    Discharge Condition: stable  Diet recommendation: may have full liquid , then soft diet in the next few days, advance diet as tolerated  Filed Weights   01/07/16 0835  Weight: 107.5 kg (237 lb)    History of present illness: (per admitting MD  Annita Brod MD)  Chief Complaint: Abdominal pain   HPI: Katherine Hurst is a 45 y.o. female with no past medical history, not on any medications who 2 days ago started having left lower quadrant abdominal pain. Initially felt to be intermittent, but then started occurring with more frequency and then continuous patient described pain in the left lower quadrant tenderness with deep palpation and she felt even worse whenever she had to move her bowels. Pain did not radiate elsewhere. Some nausea although no associated vomiting. She decided to come to the emergency room for further evaluation.  ED Course: In emergency, patient was noted to have a normal white count and no fever. Rest of her labs are unremarkable. A CT scan of the abdomen and pelvis noted left lower quadrant diverticulitis as well as diverticulosis and extensive part of her colon. Is also questionable area of microperforation in the sigmoid area although is no signs of any abscess. Hospitalists were called for evaluation after emergency room discussed with patient and given that concerns for microperforation  and that she had no outpatient PCP follow-up, it was felt best that she come in for observation   Hospital Course:  Principal Problem:   Acute diverticulitis Active Problems:   Obesity (BMI 30-39.9)   Hypokalemia   Acute sigmoid diverticulitis:   normal white count and lack of fever and no abscess seen on CT,  She is treated with cipro/flagyl/ivf/antiemetics, Improved, nausea and abdominal pain has resolved,  She is discharged home with oral cipro/flagyl, she is instructed to advance diet as tolerated.  She is to follow up with pmd in one week for hospital discharge follow up  . Obesity (BMI 30-39.9): Patient meets criteria with BMI greater than 30  . Hypokalemia: Secondary to some nausea and vomiting. Replaced.  Elevated blood pressures: Some of this may be in the setting of pain. Improved, she is to follow up with pmd  DVT prophylaxis while in the hospital: Lovenox   Code Status: Full code   Family Communication: sister and daughter at the bedside   Disposition Plan:  home 11/2  Consults called: None    Discharge Exam: BP 133/81 (BP Location: Right Arm)   Pulse 79   Temp 97.9 F (36.6 C) (Oral)   Resp 18   Ht 5\' 5"  (1.651 m)   Wt 107.5 kg (237 lb)   LMP 12/24/2015 Comment: neg preg test 01/07/2016  SpO2 99%   BMI 39.44 kg/m   General: NAD Cardiovascular: RRR Respiratory: CATBL Abdominal : nontender, +bs  Discharge Instructions You were cared for by a hospitalist during your hospital stay. If you have any questions  about your discharge medications or the care you received while you were in the hospital after you are discharged, you can call the unit and asked to speak with the hospitalist on call if the hospitalist that took care of you is not available. Once you are discharged, your primary care physician will handle any further medical issues. Please note that NO REFILLS for any discharge medications will be authorized once you are discharged, as it is  imperative that you return to your primary care physician (or establish a relationship with a primary care physician if you do not have one) for your aftercare needs so that they can reassess your need for medications and monitor your lab values.  Discharge Instructions    Diet general    Complete by:  As directed    May have soups and full liquids for a few days, then advance to soft diet, can advance diet to regular diet in one to two weeks   Increase activity slowly    Complete by:  As directed        Medication List    STOP taking these medications   HYDROcodone-acetaminophen 5-325 MG tablet Commonly known as:  NORCO/VICODIN     TAKE these medications   AZO CRANBERRY URINARY TRACT PO Take 2 tablets by mouth daily as needed. For pain   ciprofloxacin 500 MG tablet Commonly known as:  CIPRO Take 1 tablet (500 mg total) by mouth 2 (two) times daily.   metroNIDAZOLE 500 MG tablet Commonly known as:  FLAGYL Take 1 tablet (500 mg total) by mouth 3 (three) times daily.   polyethylene glycol packet Commonly known as:  MIRALAX / GLYCOLAX Take 17 g by mouth daily.   senna-docusate 8.6-50 MG tablet Commonly known as:  Senokot-S Take 1 tablet by mouth at bedtime.      No Known Allergies Follow-up Information    [facility] Follow up in 1 week(s).   Why:  hospital discharge follow up,  Contact information: [address] [place] [phone]           The results of significant diagnostics from this hospitalization (including imaging, microbiology, ancillary and laboratory) are listed below for reference.    Significant Diagnostic Studies: Ct Abdomen Pelvis W Contrast  Result Date: 01/07/2016 CLINICAL DATA:  Two day history of left lower quadrant pain EXAM: CT ABDOMEN AND PELVIS WITH CONTRAST TECHNIQUE: Multidetector CT imaging of the abdomen and pelvis was performed using the standard protocol following  bolus administration of intravenous contrast. CONTRAST:  171mL ISOVUE-300 IOPAMIDOL (ISOVUE-300) INJECTION 61% COMPARISON:  None. FINDINGS: Lower chest: There is mild bibasilar lung atelectasis. Lung bases otherwise are clear. Hepatobiliary: No focal liver lesions are evident. There are gallstones in the gallbladder. There is equivocal gallbladder wall thickening. There is no appreciable biliary duct dilatation. Pancreas: No pancreatic mass or inflammatory focus. Spleen: No splenic lesions are evident. Adrenals/Urinary Tract: Adrenals appear normal bilaterally. Kidneys bilaterally show no evidence of mass or hydronephrosis on either side. There is no renal or ureteral calculus on either side. Urinary bladder is midline with wall thickness within normal limits. Stomach/Bowel: There is sigmoid diverticulitis. There is extensive mesenteric thickening in the proximal to mid sigmoid colon. Several irregular diverticula are noted in this area. There is a tiny focus of air within this mesenteric inflammation, suggesting microperforation. There is a focal area of decreased attenuation within this area of diverticulitis measuring 1 x 1 cm may represent the  earliest changes of a developing phlegmon in this area. A well-defined abscess is not demonstrated currently in this area of diverticulitis. Elsewhere, there are diverticula scattered throughout the colon. There is no evident bowel obstruction. There is no free air beyond questionable minimal microperforation in the sigmoid colon diverticulitis region. There is no portal venous air. Vascular/Lymphatic: There is no abdominal aortic aneurysm. No vascular lesions are evident on this study. There is no adenopathy appreciable in the abdomen or pelvis. Reproductive: Uterus is antegrade. There is a cystic appearing area at the uterus -cervix junction measuring 1.9 x 1.7 cm, a probable nabothian cyst. No other pelvic mass evident. Other: Appendix appears unremarkable. No abscess  or ascites evident in the abdomen or pelvis. There is a small ventral hernia containing only fat. Musculoskeletal: There are no blastic or lytic bone lesions. There is no intramuscular or abdominal wall lesion. IMPRESSION: Extensive sigmoid diverticulitis. Suspect early phlegmon formation within this area of diverticulitis measuring approximately 1 x 1 cm. A well-defined abscess is not seen currently. There appears to be a minimal focus of microperforation within the thickened mesenteric in the proximal to mid sigmoid region. No other free air evident in the abdomen or pelvis. No bowel obstruction. No abscess elsewhere in the abdomen or pelvis. Appendix appears normal. There is cholelithiasis with equivocal gallbladder wall thickening. Probable rather prominent cervical nabothian cyst in the cervix -lower uterine segment junction. There is a small ventral hernia containing only fat. Electronically Signed   By: Lowella Grip III M.D.   On: 01/07/2016 11:36    Microbiology: No results found for this or any previous visit (from the past 240 hour(s)).   Labs: Basic Metabolic Panel:  Recent Labs Lab 01/07/16 0908 01/08/16 0421  NA 136 138  K 3.3* 3.8  CL 102 106  CO2 27 26  GLUCOSE 106* 119*  BUN 15 11  CREATININE 0.94 0.89  CALCIUM 9.3 8.6*   Liver Function Tests:  Recent Labs Lab 01/07/16 0908 01/08/16 0421  AST 15 11*  ALT 12* 9*  ALKPHOS 61 46  BILITOT 0.3 0.7  PROT 8.5* 6.7  ALBUMIN 4.2 3.3*    Recent Labs Lab 01/07/16 0908  LIPASE 27   No results for input(s): AMMONIA in the last 168 hours. CBC:  Recent Labs Lab 01/07/16 0908 01/08/16 0421  WBC 8.2 5.6  HGB 10.5* 9.4*  HCT 32.8* 29.9*  MCV 84.3 84.9  PLT 292 247   Cardiac Enzymes: No results for input(s): CKTOTAL, CKMB, CKMBINDEX, TROPONINI in the last 168 hours. BNP: BNP (last 3 results) No results for input(s): BNP in the last 8760 hours.  ProBNP (last 3 results) No results for input(s): PROBNP in  the last 8760 hours.  CBG: No results for input(s): GLUCAP in the last 168 hours.     SignedFlorencia Reasons MD, PhD  [facility] 01/08/2016, 2:41 PM

## 2018-04-15 IMAGING — CT CT ABD-PELV W/ CM
2 of 5 series · 14 of 46 positions shown, 16 images · IV contrast (ISOVUE)
Comparison: None.

CLINICAL DATA: Two day history of left lower quadrant pain

EXAM:
CT ABDOMEN AND PELVIS WITH CONTRAST
TECHNIQUE: Multidetector CT imaging of the abdomen and pelvis was performed
using the standard protocol following bolus administration of
intravenous contrast.
CONTRAST:  100mL [id number] IOPAMIDOL ([id number]) INJECTION 61%

[Series 2: abd/pel with · axial · 0.74mm/px · z∈[+1261,+1666]mm · 11 of 93 slices shown, 13 images]
[im 6/93  soft-tissue]
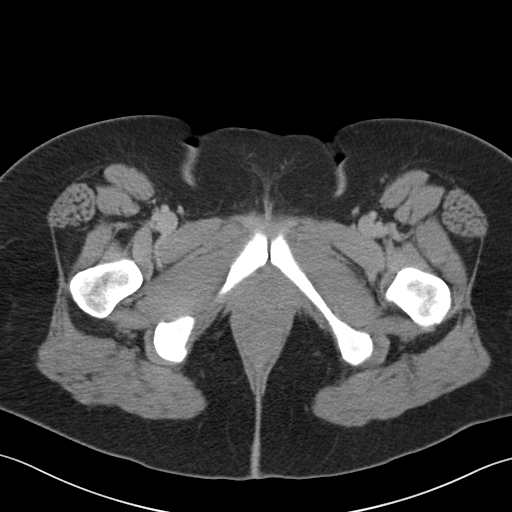
[im 6/93  bone]
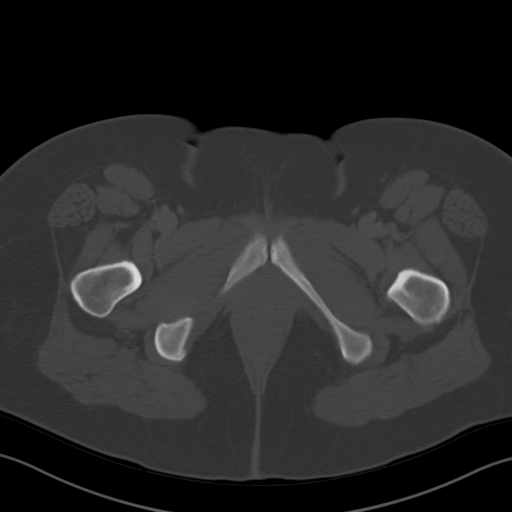
[im 18/93  soft-tissue]
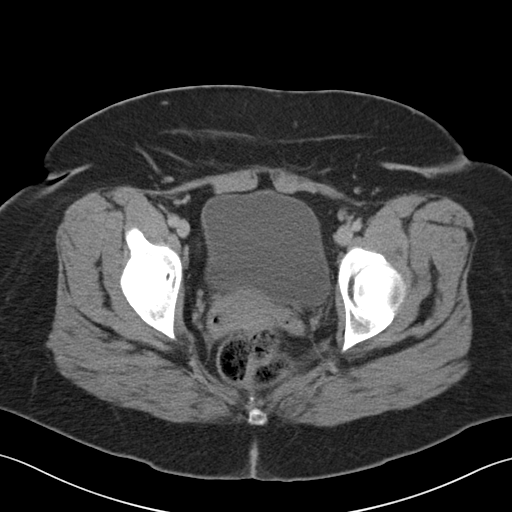
[im 24/93  soft-tissue]
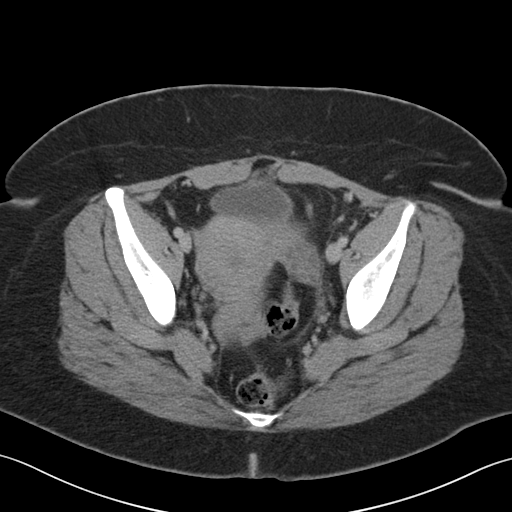
[im 29/93  soft-tissue]
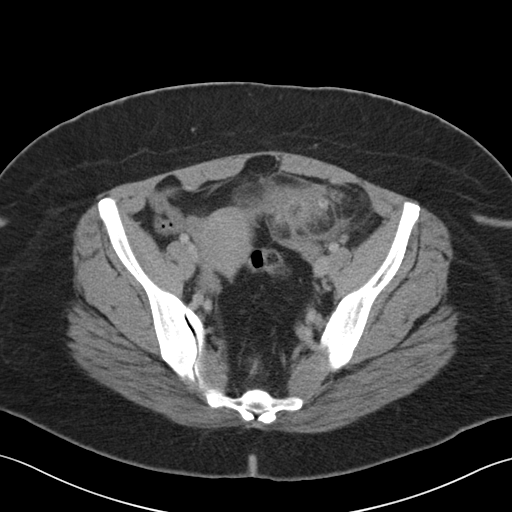
[im 41/93  soft-tissue]
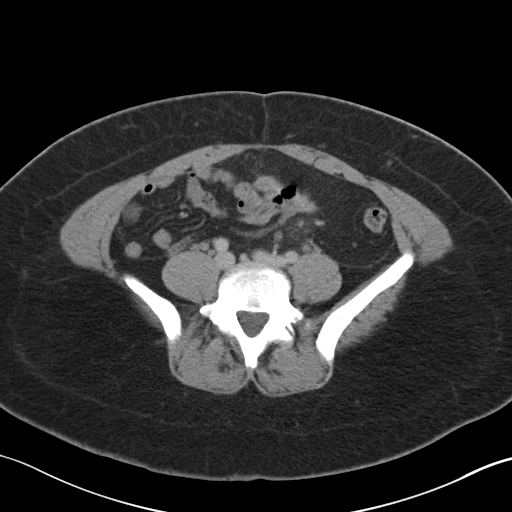
[im 47/93  soft-tissue]
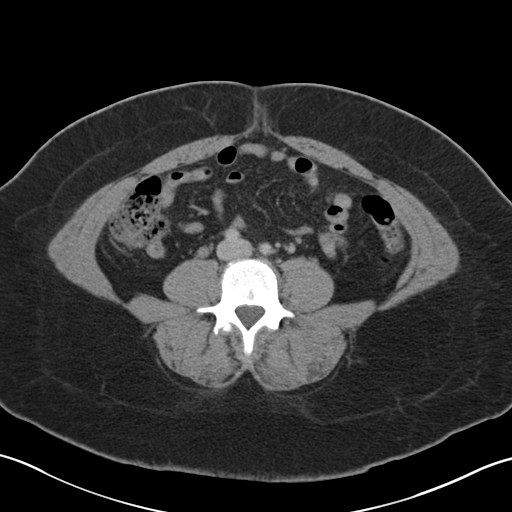
[im 52/93  soft-tissue]
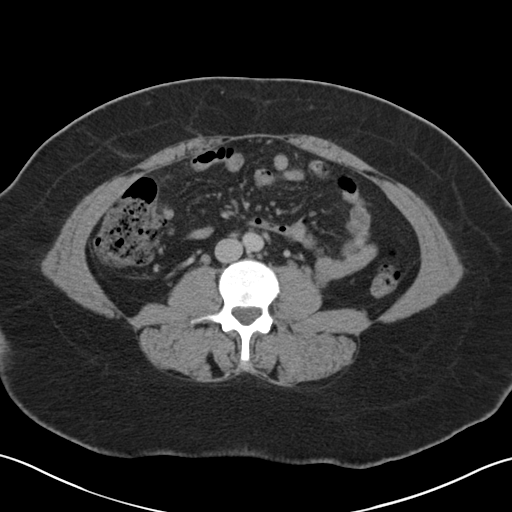
[im 64/93  soft-tissue]
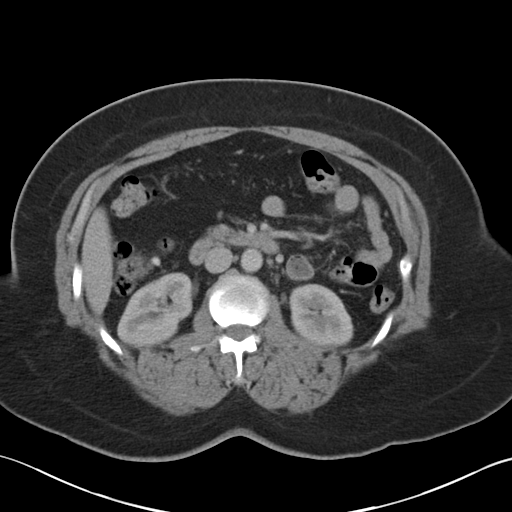
[im 70/93  soft-tissue]
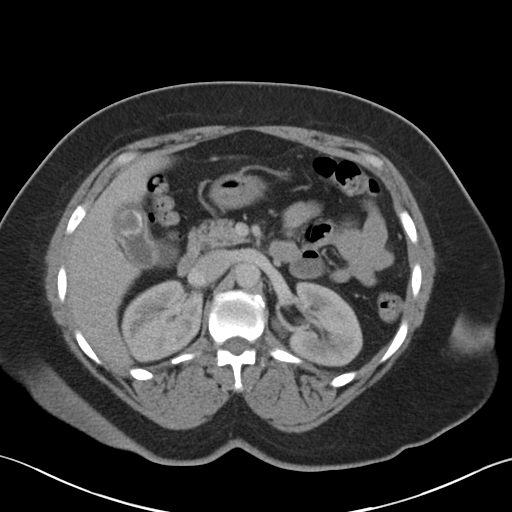
[im 70/93  bone]
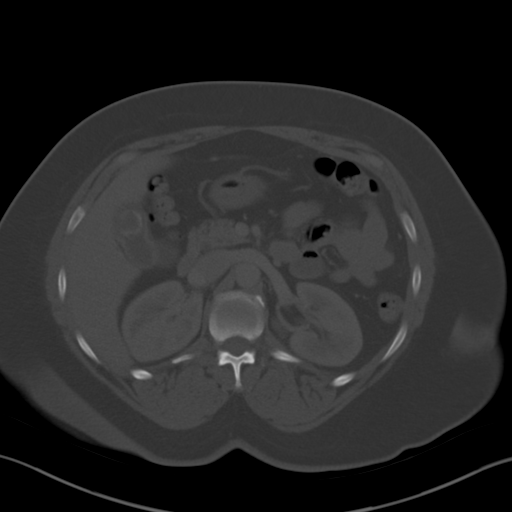
[im 75/93  soft-tissue]
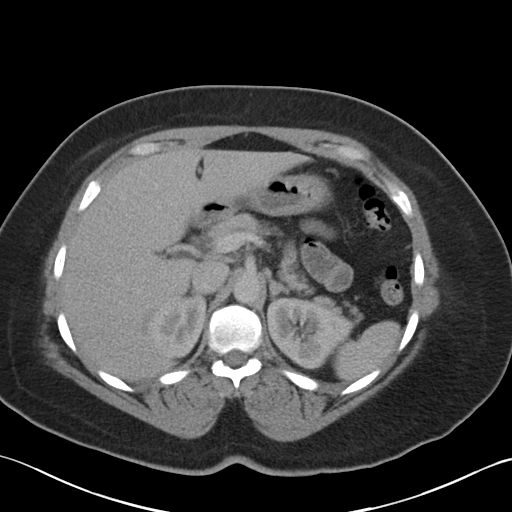
[im 87/93  soft-tissue]
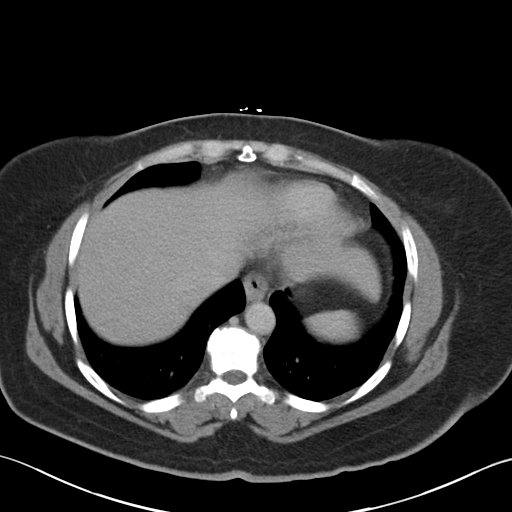

[Series 3: coronal a/|p · coronal · 0.69mm/px · 3 of 114 slices shown]
[im 38/114  soft-tissue]
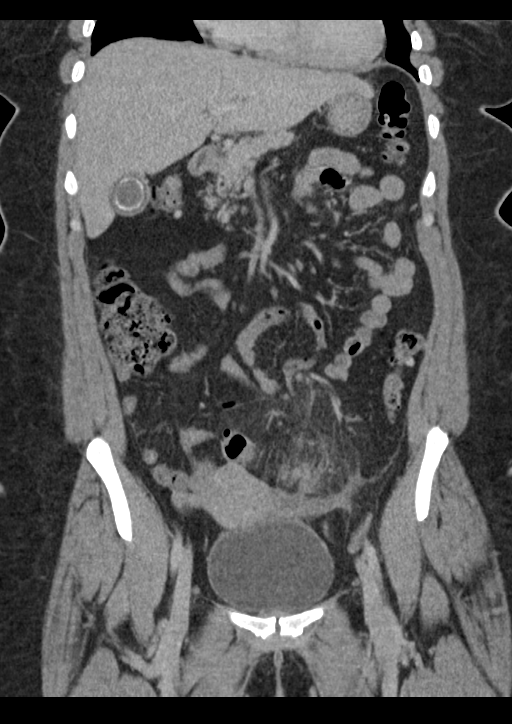
[im 51/114  soft-tissue]
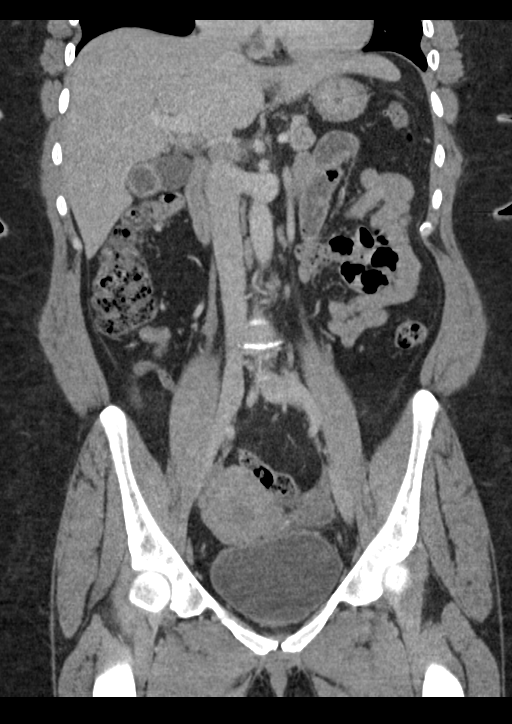
[im 63/114  soft-tissue]
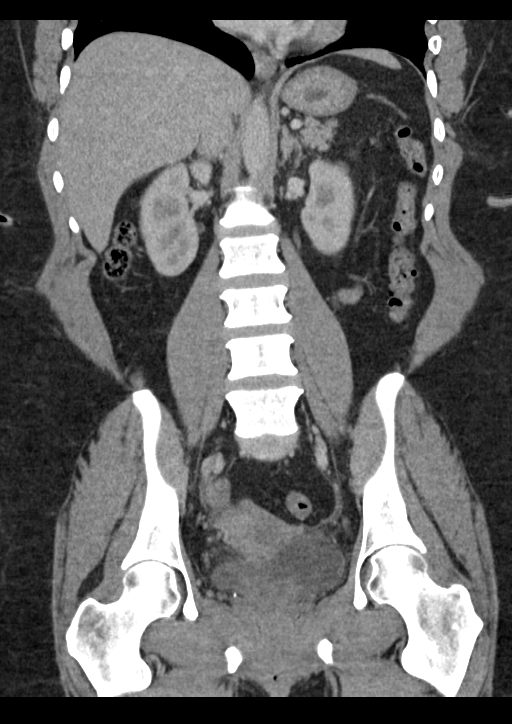

[14 of 46 positions shown; findings below may reference images not displayed]

FINDINGS: Lower chest: There is mild bibasilar lung atelectasis. Lung bases
otherwise are clear.

Hepatobiliary: No focal liver lesions are evident. There are
gallstones in the gallbladder. There is equivocal gallbladder wall
thickening. There is no appreciable biliary duct dilatation.

Pancreas: No pancreatic mass or inflammatory focus.

Spleen: No splenic lesions are evident.

Adrenals/Urinary Tract: Adrenals appear normal bilaterally. Kidneys
bilaterally show no evidence of mass or hydronephrosis on either
side. There is no renal or ureteral calculus on either side. Urinary
bladder is midline with wall thickness within normal limits.

Stomach/Bowel: There is sigmoid diverticulitis. There is extensive
mesenteric thickening in the proximal to mid sigmoid colon. Several
irregular diverticula are noted in this area. There is a tiny focus
of air within this mesenteric inflammation, suggesting
microperforation. There is a focal area of decreased attenuation
within this area of diverticulitis measuring 1 x 1 cm may represent
the earliest changes of a developing phlegmon in this area. A
well-defined abscess is not demonstrated currently in this area of
diverticulitis.

Elsewhere, there are diverticula scattered throughout the colon.
There is no evident bowel obstruction. There is no free air beyond
questionable minimal microperforation in the sigmoid colon
diverticulitis region. There is no portal venous air.

Vascular/Lymphatic: There is no abdominal aortic aneurysm. No
vascular lesions are evident on this study. There is no adenopathy
appreciable in the abdomen or pelvis.

Reproductive: Uterus is antegrade. There is a cystic appearing area
at the uterus -cervix junction measuring 1.9 x 1.7 cm, a probable
nabothian cyst. No other pelvic mass evident.

Other: Appendix appears unremarkable. No abscess or ascites evident
in the abdomen or pelvis. There is a small ventral hernia containing
only fat.

Musculoskeletal: There are no blastic or lytic bone lesions. There
is no intramuscular or abdominal wall lesion.
IMPRESSION: Extensive sigmoid diverticulitis. Suspect early phlegmon formation
within this area of diverticulitis measuring approximately 1 x 1 cm.
A well-defined abscess is not seen currently. There appears to be a
minimal focus of microperforation within the thickened mesenteric in
the proximal to mid sigmoid region. No other free air evident in the
abdomen or pelvis.

No bowel obstruction. No abscess elsewhere in the abdomen or pelvis.
Appendix appears normal.

There is cholelithiasis with equivocal gallbladder wall thickening.

Probable rather prominent cervical nabothian cyst in the cervix
-lower uterine segment junction.

There is a small ventral hernia containing only fat.

## 2020-12-08 DIAGNOSIS — K029 Dental caries, unspecified: Secondary | ICD-10-CM | POA: Diagnosis not present

## 2020-12-08 DIAGNOSIS — R030 Elevated blood-pressure reading, without diagnosis of hypertension: Secondary | ICD-10-CM | POA: Diagnosis not present

## 2022-09-21 ENCOUNTER — Ambulatory Visit: Payer: BC Managed Care – PPO | Admitting: Medical

## 2022-09-21 ENCOUNTER — Other Ambulatory Visit (HOSPITAL_COMMUNITY)
Admission: RE | Admit: 2022-09-21 | Discharge: 2022-09-21 | Disposition: A | Discharge status: Home / Self Care | Payer: BC Managed Care – PPO | Source: Ambulatory Visit | Attending: Medical | Admitting: Medical

## 2022-09-21 ENCOUNTER — Encounter: Payer: Self-pay | Admitting: Medical

## 2022-09-21 VITALS — BP 160/100 | HR 76 | Ht 65.0 in | Wt 266.0 lb

## 2022-09-21 DIAGNOSIS — R030 Elevated blood-pressure reading, without diagnosis of hypertension: Secondary | ICD-10-CM

## 2022-09-21 DIAGNOSIS — Z136 Encounter for screening for cardiovascular disorders: Secondary | ICD-10-CM | POA: Diagnosis not present

## 2022-09-21 DIAGNOSIS — Z113 Encounter for screening for infections with a predominantly sexual mode of transmission: Secondary | ICD-10-CM

## 2022-09-21 DIAGNOSIS — Z1211 Encounter for screening for malignant neoplasm of colon: Secondary | ICD-10-CM | POA: Insufficient documentation

## 2022-09-21 DIAGNOSIS — Z0000 Encounter for general adult medical examination without abnormal findings: Secondary | ICD-10-CM | POA: Diagnosis not present

## 2022-09-21 DIAGNOSIS — Z124 Encounter for screening for malignant neoplasm of cervix: Secondary | ICD-10-CM | POA: Diagnosis not present

## 2022-09-21 DIAGNOSIS — Z131 Encounter for screening for diabetes mellitus: Secondary | ICD-10-CM

## 2022-09-21 DIAGNOSIS — Z13220 Encounter for screening for lipoid disorders: Secondary | ICD-10-CM

## 2022-09-21 DIAGNOSIS — Z7185 Encounter for immunization safety counseling: Secondary | ICD-10-CM | POA: Diagnosis not present

## 2022-09-21 DIAGNOSIS — H9192 Unspecified hearing loss, left ear: Secondary | ICD-10-CM

## 2022-09-21 DIAGNOSIS — Z1231 Encounter for screening mammogram for malignant neoplasm of breast: Secondary | ICD-10-CM | POA: Insufficient documentation

## 2022-09-21 LAB — POCT URINALYSIS DIP (PROADVANTAGE DEVICE)
Bilirubin, UA: NEGATIVE
Blood, UA: NEGATIVE
Glucose, UA: NEGATIVE mg/dL
Ketones, POC UA: NEGATIVE mg/dL
Nitrite, UA: NEGATIVE
Protein Ur, POC: NEGATIVE mg/dL
Specific Gravity, Urine: 1.015
Urobilinogen, Ur: 0.2
pH, UA: 6 (ref 5.0–8.0)

## 2022-09-21 NOTE — Progress Notes (Signed)
Subjective:   HPI  Katherine Hurst is a 52 y.o. female who presents for Chief Complaint  Patient presents with   Annual Exam    NP annual exam. Patient is not fasting today, she had ice cream. Right hip pain, when she turns a certain way x 1 week. 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 Last pap 24 years +. Has never had colonoscopy. Family hx of diabetes-mother and sister (who is with her today). UA showed small leuks, patient was asymptomatic.     Patient Care Team: Laneya Gasaway, Cleda Mccreedy as PCP - General (Family Medicine)   Concerns: Here today for well visit.  She is accompanied by her sister who recently establish care with me as well  She is nonfasting today.  She has not seen a doctor regularly in many years.  No prior colonoscopy.  Pap smear many years ago.  Last menstrual period 1.5 years ago.  She does get hot flashes.  There is a family history of diabetes and blood pressure problems.  She has never had problems with either  Reviewed their medical, surgical, family, social, medication, and allergy history and updated chart as appropriate.  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   No current facility-administered medications on file prior to visit.     No current outpatient medications on file.  Family History  Problem Relation Age of Onset   Diabetes Mother    Hypertension Mother    Stroke Father    Hypertension Father    Aneurysm Father        brain   Diabetes Sister    Asthma Sister    Cancer Neg Hx     Past Surgical History:  Procedure Laterality Date   CESAREAN SECTION     EAR CYST EXCISION N/A 04/26/2012   Procedure: EXCISION OF SCALP CYST;  Surgeon: Wayland Denis, DO;  Location: [facility];  Service: Plastics;  Laterality: N/A;   TUBAL LIGATION     13 years ago     Review of  Systems  Constitutional:  Negative for chills, fever, malaise/fatigue and weight loss.  HENT:  Negative for congestion, ear pain, hearing loss, sore throat and tinnitus.   Eyes:  Negative for blurred vision, pain and redness.  Respiratory:  Negative for cough, hemoptysis and shortness of breath.   Cardiovascular:  Negative for chest pain, palpitations, orthopnea, claudication and leg swelling.  Gastrointestinal:  Negative for abdominal pain, blood in stool, constipation, diarrhea, nausea and vomiting.  Genitourinary:  Negative for dysuria, flank pain, frequency, hematuria and urgency.  Musculoskeletal:  Negative for falls, joint pain and myalgias.  Skin:  Negative for itching and rash.  Neurological:  Negative for dizziness, tingling, speech change, weakness and headaches.  Endo/Heme/Allergies:  Negative for polydipsia. Does not bruise/bleed easily.  Psychiatric/Behavioral:  Negative for depression and memory loss. The patient is not nervous/anxious and does not have insomnia.         Objective:  BP (!) 160/100 (BP Location: Right Arm, Cuff Size: Large)   Pulse 76   Ht 5\' 5"  (1.651 m)   Wt 266 lb (120.7 kg)   LMP 12/24/2015 Comment: neg preg test 01/07/2016  SpO2 98%   BMI 44.26 kg/m   General appearance: alert, no distress, WD/WN, African American female Skin: Unremarkable  HEENT: normocephalic, conjunctiva/corneas normal, sclerae anicteric, PERRLA, EOMi, nares patent, no discharge or erythema, pharynx normal Oral cavity: MMM, tongue normal, teeth in good repair other than molar decay right upper Neck: supple, no lymphadenopathy, no thyromegaly, no masses, normal ROM, no bruits Chest: non tender, normal shape and expansion Heart: RRR, normal S1, S2, no murmurs Lungs: CTA bilaterally, no wheezes, rhonchi, or rales Abdomen: +bs, soft, non tender, non distended, no masses, no hepatomegaly, no splenomegaly, no bruits Back: non tender, normal ROM, no scoliosis Musculoskeletal: upper  extremities non tender, no obvious deformity, normal ROM throughout, lower extremities non tender, no obvious deformity, normal ROM throughout Extremities: no edema, no cyanosis, no clubbing Pulses: 2+ symmetric, upper and lower extremities, normal cap refill Neurological: alert, oriented x 3, CN2-12 intact, strength normal upper extremities and lower extremities, sensation normal throughout, DTRs 2+ throughout, no cerebellar signs, gait normal Psychiatric: normal affect, behavior normal, pleasant  Breast: tattoo right upper breast, nontender, no masses or lumps, no skin changes, no nipple discharge or inversion, no axillary lymphadenopathy Gyn: Normal external genitalia without lesions, difficult exam due to body habitus, vagina with normal mucosa, cervix without lesions, no cervical motion tenderness, mild amount of whitish yellow vaginal discharge.  Uterus and adnexa not enlarged, nontender, no masses.   Pap performed.  Exam chaperoned by nurse. Rectal: deferred  EKG reviewed    Assessment and Plan :   Encounter Diagnoses  Name Primary?   Annual physical exam Yes   Screening for colon cancer    Screening for cervical cancer    Vaccine counseling    Screen for STD (sexually transmitted disease)    Screening for diabetes mellitus    Screening for lipid disorders    Elevated blood pressure reading in office without diagnosis of hypertension    Encounter for screening mammogram for malignant neoplasm of breast    Partial deafness of left ear    Screening for heart disease      This visit was a preventative care visit, also known as wellness visit or routine physical.   Topics typically include healthy lifestyle, diet, exercise, preventative care, vaccinations, sick and well care, proper use of emergency dept and after hours care, as well as other concerns.    Separate significant issues discussed: Your blood pressure is elevated today.  It was elevated on 2 different readings.  We  will check it again tomorrow when you come in for fasting labs.  I will likely need to start you on blood pressure medication given the findings since it is stage II high blood pressure.  I do recommend working on efforts to lose weight through healthy diet and exercise.  We will call with your lab results   General Recommendations: Continue to return yearly for your annual wellness and preventative care visits.  This gives [place] a chance to discuss healthy lifestyle, exercise, vaccinations, review your chart record, and perform screenings where appropriate.  I recommend you see your eye doctor yearly for routine vision care.  I recommend you see your dentist yearly for routine dental care including hygiene visits twice yearly.   Vaccination recommendations were reviewed Immunization History  Administered Date(s) Administered   DTaP 02/19/1969, 04/01/1969, 06/18/1969, 11/20/1970, 01/03/1975   Hepatitis B 12/21/2016   Hepatitis B, ADULT 08/31/2015   IPV 02/19/1969, 06/18/1969, 05/08/1970, 11/20/1970   MMR 02/06/1970   Meningococcal Conjugate 01/03/1975   Tdap 08/31/2015    Vaccine recommendations: Consider Shingrix vaccine, 2 shots 2 months apart Yearly flu shot in  the fall   Screening for cancer: Colon cancer screening: We will refer you for screening colonoscopy  Breast cancer screening: You should perform a self breast exam monthly.   We reviewed recommendations for regular mammograms and breast cancer screening.   Cervical cancer screening: We reviewed recommendations for pap smear screening.    Skin cancer screening: Check your skin regularly for new changes, growing lesions, or other lesions of concern Come in for evaluation if you have skin lesions of concern.  Lung cancer screening: If you have a greater than 20 pack year history of tobacco use, then you may qualify for lung cancer screening with a chest CT scan.   Please call your insurance company to inquire  about coverage for this test.  Pancreatic cancer: no current screening test is available routinely recommended.  (Risk factors: Smoking, overweight or obese, diabetes, chronic pancreatitis, work Nurse, mental health, Solicitor, 23 year old or greater, female greater than female, African-American, family history of pancreatic cancer, hereditary breast, ovarian, melanoma, Lynch, Peutz-jeghers).  We currently don't have screenings for other cancers besides breast, cervical, colon, and lung cancers.  If you have a strong family history of cancer or have other cancer screening concerns, please let me know.    Bone health: Get at least 150 minutes of aerobic exercise weekly Get weight bearing exercise at least once weekly Bone density test:  A bone density test is an imaging test that uses a type of X-ray to measure the amount of calcium and other minerals in your bones. The test may be used to diagnose or screen you for a condition that causes weak or thin bones (osteoporosis), predict your risk for a broken bone (fracture), or determine how well your osteoporosis treatment is working. The bone density test is recommended for females 65 and older, or females or males <65 if certain risk factors such as thyroid disease, long term use of steroids such as for asthma or rheumatological issues, vitamin D deficiency, estrogen deficiency, family history of osteoporosis, self or family history of fragility fracture in first degree relative.    Heart health: Get at least 150 minutes of aerobic exercise weekly Limit alcohol It is important to maintain a healthy blood pressure and healthy cholesterol numbers  Heart disease screening: Screening for heart disease includes screening for blood pressure, fasting lipids, glucose/diabetes screening, BMI height to weight ratio, reviewed of smoking status, physical activity, and diet.    Goals include blood pressure 120/80 or less, maintaining a healthy  lipid/cholesterol profile, preventing diabetes or keeping diabetes numbers under good control, not smoking or using tobacco products, exercising most days per week or at least 150 minutes per week of exercise, and eating healthy variety of fruits and vegetables, healthy oils, and avoiding unhealthy food choices like fried food, fast food, high sugar and high cholesterol foods.    Other tests may possibly include EKG test, CT coronary calcium score, echocardiogram, exercise treadmill stress test.   Vascular disease screening: For high risk individuals including smokers, diabetes, patients with known heart disease or high blood pressure, kidney disease, and others, screening for vascular disease or atherosclerosis of the arteries is available.  Examples may include carotid ultrasound, abdominal aortic ultrasound, ABI blood flow screening in the legs, thoracic aorta screening.    Medical care options: I recommend you continue to seek care here first for routine care.  We try really hard to have available appointments Monday through Friday daytime hours for sick visits, acute visits, and physicals.  Urgent care should  be used for after hours and weekends for significant issues that cannot wait till the next day.  The emergency department should be used for significant potentially life-threatening emergencies.  The emergency department is expensive, can often have long wait times for less significant concerns, so try to utilize primary care, urgent care, or telemedicine when possible to avoid unnecessary trips to the emergency department.  Virtual visits and telemedicine have been introduced since the pandemic started in 2020, and can be convenient ways to receive medical care.  We offer virtual appointments as well to assist you in a variety of options to seek medical care.   Legal Take the time to do a Last Will and Testament, advanced directives including Healthcare Power of Attorney and Living Will  documents.  Do not leave your family with burdens that can be handled ahead of time.   Advanced Directives: I recommend you consider completing a Health Care Power of Attorney and Living Will.   These documents respect your wishes and help alleviate burdens on your loved ones if you were to become terminally ill or be in a position to need those documents enforced.    You can complete Advanced Directives yourself, have them notarized, then have copies made for our office, for you and for anybody you feel should have them in safe keeping.  Or, you can have an attorney prepare these documents.   If you haven't updated your Last Will and Testament in a while, it may be worthwhile having an attorney prepare these documents together and save on some costs.       Spiritual and Emotional Health Keeping a healthy spiritual life can help you better manage your physical health. Your spiritual life can help you to cope with any issues that may arise with your physical health.  Balance can keep [place] healthy and help [place] to recover.  If you are struggling with your spiritual health there are questions that you may want to ask yourself:  What makes me feel most complete? When do I feel most connected to the rest of the world? Where do I find the most inner strength? What am I doing when I feel whole?  Helpful tips: Being in nature. Some people feel very connected and at peace when they are walking outdoors or are outside. Helping others. Some feel the largest sense of wellbeing when they are of service to others. Being of service can take on many forms. It can be doing volunteer work, being kind to strangers, or offering a hand to a friend in need. Gratitude. Some people find they feel the most connected when they remain grateful. They may make lists of all the things they are grateful for or say a thank you out loud for all they have.    Emotional Health Are you in tune with your emotional health?   Check out this link: [email or web address]-[email or web address]   Financial Health Make sure you use a budget for your personal finances Make sure you are insured against risks (health insurance, life insurance, auto insurance, etc) Save more, spend less Set financial goals If you need help in this area, good resources include counseling through [employer] or other community resources, have a meeting with a Social research officer, government, and a good resource is Salley Slaughter podcast    Latesa was seen today for annual exam.  Diagnoses and all orders for this visit:  Annual physical exam -     POCT Urinalysis DIP (Proadvantage Device) -  Comprehensive metabolic panel; Future -     CBC; Future -     Lipid panel; Future -     Hemoglobin A1c; Future -     TSH; Future -     RPR+HIV+GC+CT Panel; Future -     Hepatitis C antibody; Future -     Hepatitis B surface antigen; Future -     MM 3D SCREENING MAMMOGRAM BILATERAL BREAST -     Ambulatory referral to Gastroenterology -     EKG 12-Lead -     Cytology - PAP([place])  Screening for colon cancer -     Ambulatory referral to Gastroenterology  Screening for cervical cancer -     Cytology - PAP([place])  Vaccine counseling  Screen for STD (sexually transmitted disease) -     RPR+HIV+GC+CT Panel; Future -     Hepatitis C antibody; Future -     Hepatitis B surface antigen; Future  Screening for diabetes mellitus -     Hemoglobin A1c; Future  Screening for lipid disorders -     Lipid panel; Future  Elevated blood pressure reading in office without diagnosis of hypertension -     EKG 12-Lead  Encounter for screening mammogram for malignant neoplasm of breast -     MM 3D SCREENING MAMMOGRAM BILATERAL BREAST  Partial deafness of left ear  Screening for heart disease -     EKG 12-Lead    Follow-up pending labs, yearly for physical

## 2022-09-21 NOTE — Patient Instructions (Signed)
This visit was a preventative care visit, also known as wellness visit or routine physical.   Topics typically include healthy lifestyle, diet, exercise, preventative care, vaccinations, sick and well care, proper use of emergency dept and after hours care, as well as other concerns.    Separate significant issues discussed: Your blood pressure is elevated today.  It was elevated on 2 different readings.  We will check it again tomorrow when you come in for fasting labs.  I will likely need to start you on blood pressure medication given the findings since it is stage II high blood pressure.  I do recommend working on efforts to lose weight through healthy diet and exercise.  We will call with your lab results   General Recommendations: Continue to return yearly for your annual wellness and preventative care visits.  This gives [place] a chance to discuss healthy lifestyle, exercise, vaccinations, review your chart record, and perform screenings where appropriate.  I recommend you see your eye doctor yearly for routine vision care.  I recommend you see your dentist yearly for routine dental care including hygiene visits twice yearly.   Vaccination recommendations were reviewed Immunization History  Administered Date(s) Administered   DTaP 02/19/1969, 04/01/1969, 06/18/1969, 11/20/1970, 01/03/1975   Hepatitis B 12/21/2016   Hepatitis B, ADULT 08/31/2015   IPV 02/19/1969, 06/18/1969, 05/08/1970, 11/20/1970   MMR 02/06/1970   Meningococcal Conjugate 01/03/1975   Tdap 08/31/2015    Vaccine recommendations: Consider Shingrix vaccine, 2 shots 2 months apart Yearly flu shot in the fall   Screening for cancer: Colon cancer screening: We will refer you for screening colonoscopy  Breast cancer screening: You should perform a self breast exam monthly.   We reviewed recommendations for regular mammograms and breast cancer screening.   Cervical cancer screening: We reviewed  recommendations for pap smear screening.    Skin cancer screening: Check your skin regularly for new changes, growing lesions, or other lesions of concern Come in for evaluation if you have skin lesions of concern.  Lung cancer screening: If you have a greater than 20 pack year history of tobacco use, then you may qualify for lung cancer screening with a chest CT scan.   Please call your insurance company to inquire about coverage for this test.  Pancreatic cancer: no current screening test is available routinely recommended.  (Risk factors: Smoking, overweight or obese, diabetes, chronic pancreatitis, work Nurse, mental health, Solicitor, 95 year old or greater, female greater than female, African-American, family history of pancreatic cancer, hereditary breast, ovarian, melanoma, Lynch, Peutz-jeghers).  We currently don't have screenings for other cancers besides breast, cervical, colon, and lung cancers.  If you have a strong family history of cancer or have other cancer screening concerns, please let me know.    Bone health: Get at least 150 minutes of aerobic exercise weekly Get weight bearing exercise at least once weekly Bone density test:  A bone density test is an imaging test that uses a type of X-ray to measure the amount of calcium and other minerals in your bones. The test may be used to diagnose or screen you for a condition that causes weak or thin bones (osteoporosis), predict your risk for a broken bone (fracture), or determine how well your osteoporosis treatment is working. The bone density test is recommended for females 65 and older, or females or males <65 if certain risk factors such as thyroid disease, long term use of steroids such as for asthma or rheumatological issues, vitamin D deficiency, estrogen deficiency, family  history of osteoporosis, self or family history of fragility fracture in first degree relative.    Heart health: Get at least 150 minutes of  aerobic exercise weekly Limit alcohol It is important to maintain a healthy blood pressure and healthy cholesterol numbers  Heart disease screening: Screening for heart disease includes screening for blood pressure, fasting lipids, glucose/diabetes screening, BMI height to weight ratio, reviewed of smoking status, physical activity, and diet.    Goals include blood pressure 120/80 or less, maintaining a healthy lipid/cholesterol profile, preventing diabetes or keeping diabetes numbers under good control, not smoking or using tobacco products, exercising most days per week or at least 150 minutes per week of exercise, and eating healthy variety of fruits and vegetables, healthy oils, and avoiding unhealthy food choices like fried food, fast food, high sugar and high cholesterol foods.    Other tests may possibly include EKG test, CT coronary calcium score, echocardiogram, exercise treadmill stress test.   Vascular disease screening: For high risk individuals including smokers, diabetes, patients with known heart disease or high blood pressure, kidney disease, and others, screening for vascular disease or atherosclerosis of the arteries is available.  Examples may include carotid ultrasound, abdominal aortic ultrasound, ABI blood flow screening in the legs, thoracic aorta screening.    Medical care options: I recommend you continue to seek care here first for routine care.  We try really hard to have available appointments Monday through Friday daytime hours for sick visits, acute visits, and physicals.  Urgent care should be used for after hours and weekends for significant issues that cannot wait till the next day.  The emergency department should be used for significant potentially life-threatening emergencies.  The emergency department is expensive, can often have long wait times for less significant concerns, so try to utilize primary care, urgent care, or telemedicine when possible to avoid  unnecessary trips to the emergency department.  Virtual visits and telemedicine have been introduced since the pandemic started in 2020, and can be convenient ways to receive medical care.  We offer virtual appointments as well to assist you in a variety of options to seek medical care.   Legal Take the time to do a Last Will and Testament, advanced directives including Healthcare Power of Attorney and Living Will documents.  Do not leave your family with burdens that can be handled ahead of time.   Advanced Directives: I recommend you consider completing a Health Care Power of Attorney and Living Will.   These documents respect your wishes and help alleviate burdens on your loved ones if you were to become terminally ill or be in a position to need those documents enforced.    You can complete Advanced Directives yourself, have them notarized, then have copies made for our office, for you and for anybody you feel should have them in safe keeping.  Or, you can have an attorney prepare these documents.   If you haven't updated your Last Will and Testament in a while, it may be worthwhile having an attorney prepare these documents together and save on some costs.       Spiritual and Emotional Health Keeping a healthy spiritual life can help you better manage your physical health. Your spiritual life can help you to cope with any issues that may arise with your physical health.  Balance can keep [place] healthy and help [place] to recover.  If you are struggling with your spiritual health there are questions that you may want to ask  yourself:  What makes me feel most complete? When do I feel most connected to the rest of the world? Where do I find the most inner strength? What am I doing when I feel whole?  Helpful tips: Being in nature. Some people feel very connected and at peace when they are walking outdoors or are outside. Helping others. Some feel the largest sense of wellbeing when they are of  service to others. Being of service can take on many forms. It can be doing volunteer work, being kind to strangers, or offering a hand to a friend in need. Gratitude. Some people find they feel the most connected when they remain grateful. They may make lists of all the things they are grateful for or say a thank you out loud for all they have.    Emotional Health Are you in tune with your emotional health?  Check out this link: http://www.marquez-love.com/   Financial Health Make sure you use a budget for your personal finances Make sure you are insured against risks (health insurance, life insurance, auto insurance, etc) Save more, spend less Set financial goals If you need help in this area, good resources include counseling through [employer] or other community resources, have a meeting with a Social research officer, government, and a good resource is [employer]

## 2022-09-22 ENCOUNTER — Other Ambulatory Visit: Payer: Self-pay | Admitting: Medical

## 2022-09-22 ENCOUNTER — Other Ambulatory Visit: Payer: BC Managed Care – PPO

## 2022-09-22 DIAGNOSIS — Z13220 Encounter for screening for lipoid disorders: Secondary | ICD-10-CM

## 2022-09-22 DIAGNOSIS — Z131 Encounter for screening for diabetes mellitus: Secondary | ICD-10-CM | POA: Diagnosis not present

## 2022-09-22 DIAGNOSIS — Z113 Encounter for screening for infections with a predominantly sexual mode of transmission: Secondary | ICD-10-CM

## 2022-09-22 DIAGNOSIS — Z0000 Encounter for general adult medical examination without abnormal findings: Secondary | ICD-10-CM

## 2022-09-22 LAB — CYTOLOGY - PAP
Comment: NEGATIVE
Diagnosis: NEGATIVE
High risk HPV: NEGATIVE

## 2022-09-22 LAB — LIPID PANEL

## 2022-09-22 MED ORDER — METRONIDAZOLE 500 MG PO TABS: ORAL_TABLET | ORAL | 0 refills | Status: DC

## 2022-09-22 MED ORDER — VALSARTAN-HYDROCHLOROTHIAZIDE 80-12.5 MG PO TABS: 1.0000 | ORAL_TABLET | Freq: Every day | ORAL | 1 refills | Status: DC

## 2022-09-22 NOTE — Progress Notes (Unsigned)
Per Vincenza Hews- since bp still high, I do think we should start bp medication which I will send to pharmacy   Spoke with patient and she is agreeable

## 2022-09-22 NOTE — Progress Notes (Signed)
 Results sent through MyChart

## 2022-09-23 LAB — CBC
RBC: 4.51 x10E6/uL (ref 3.77–5.28)
RDW: 12.9 % (ref 11.7–15.4)

## 2022-09-23 LAB — LIPID PANEL
Triglycerides: 52 mg/dL (ref 0–149)
VLDL Cholesterol Cal: 10 mg/dL (ref 5–40)

## 2022-09-23 LAB — COMPREHENSIVE METABOLIC PANEL
AST: 18 IU/L (ref 0–40)
BUN: 16 mg/dL (ref 6–24)
Bilirubin Total: 0.3 mg/dL (ref 0.0–1.2)
CO2: 23 mmol/L (ref 20–29)
Calcium: 9.9 mg/dL (ref 8.7–10.2)
Glucose: 95 mg/dL (ref 70–99)
Potassium: 4.4 mmol/L (ref 3.5–5.2)
Sodium: 139 mmol/L (ref 134–144)

## 2022-09-23 LAB — TSH: TSH: 4.01 u[IU]/mL (ref 0.450–4.500)

## 2022-09-23 LAB — RPR+HIV+GC+CT PANEL: RPR Ser Ql: NONREACTIVE

## 2022-09-23 LAB — HEMOGLOBIN A1C: Est. average glucose Bld gHb Est-mCnc: 126 mg/dL

## 2022-09-23 LAB — HEPATITIS C ANTIBODY: Hep C Virus Ab: NONREACTIVE

## 2022-09-23 NOTE — Progress Notes (Signed)
Results sent through MyChart  Schedule I month follow-up on blood pressure

## 2022-09-25 LAB — CBC
Hematocrit: 38.8 % (ref 34.0–46.6)
Hemoglobin: 11.7 g/dL (ref 11.1–15.9)
MCH: 25.9 pg — ABNORMAL LOW (ref 26.6–33.0)
MCHC: 30.2 g/dL — ABNORMAL LOW (ref 31.5–35.7)
MCV: 86 fL (ref 79–97)
Platelets: 239 10*3/uL (ref 150–450)
WBC: 5.3 10*3/uL (ref 3.4–10.8)

## 2022-09-25 LAB — COMPREHENSIVE METABOLIC PANEL
ALT: 18 IU/L (ref 0–32)
Albumin: 4.5 g/dL (ref 3.8–4.9)
Alkaline Phosphatase: 86 IU/L (ref 44–121)
BUN/Creatinine Ratio: 17 (ref 9–23)
Chloride: 102 mmol/L (ref 96–106)
Creatinine, Ser: 0.96 mg/dL (ref 0.57–1.00)
Globulin, Total: 2.9 g/dL (ref 1.5–4.5)
Total Protein: 7.4 g/dL (ref 6.0–8.5)
eGFR: 71 mL/min/{1.73_m2} (ref >59)

## 2022-09-25 LAB — LIPID PANEL
Chol/HDL Ratio: 2.5 ratio (ref 0.0–4.4)
Cholesterol, Total: 190 mg/dL (ref 100–199)
HDL: 77 mg/dL (ref >39)
LDL Chol Calc (NIH): 103 mg/dL — ABNORMAL HIGH (ref 0–99)

## 2022-09-25 LAB — RPR+HIV+GC+CT PANEL
Chlamydia trachomatis, NAA: NEGATIVE
HIV Screen 4th Generation wRfx: NONREACTIVE
Neisseria Gonorrhoeae by PCR: NEGATIVE

## 2022-09-25 LAB — HEPATITIS B SURFACE ANTIGEN: Hepatitis B Surface Ag: NEGATIVE

## 2022-09-25 LAB — HEMOGLOBIN A1C: Hgb A1c MFr Bld: 6 % — ABNORMAL HIGH (ref 4.8–5.6)

## 2022-09-26 NOTE — Progress Notes (Signed)
Results sent through MyChart

## 2022-10-25 ENCOUNTER — Encounter: Payer: Self-pay | Admitting: Medical

## 2022-10-25 ENCOUNTER — Ambulatory Visit: Payer: BC Managed Care – PPO | Admitting: Medical

## 2022-10-25 VITALS — BP 138/80 | HR 70 | Ht 65.0 in | Wt 263.0 lb

## 2022-10-25 DIAGNOSIS — I10 Essential (primary) hypertension: Secondary | ICD-10-CM | POA: Diagnosis not present

## 2022-10-25 DIAGNOSIS — A5901 Trichomonal vulvovaginitis: Secondary | ICD-10-CM | POA: Diagnosis not present

## 2022-10-25 DIAGNOSIS — M7631 Iliotibial band syndrome, right leg: Secondary | ICD-10-CM | POA: Diagnosis not present

## 2022-10-25 DIAGNOSIS — R7303 Prediabetes: Secondary | ICD-10-CM | POA: Diagnosis not present

## 2022-10-25 DIAGNOSIS — M79604 Pain in right leg: Secondary | ICD-10-CM

## 2022-10-25 DIAGNOSIS — I159 Secondary hypertension, unspecified: Secondary | ICD-10-CM | POA: Insufficient documentation

## 2022-10-25 DIAGNOSIS — Z7185 Encounter for immunization safety counseling: Secondary | ICD-10-CM

## 2022-10-25 MED ORDER — VALSARTAN-HYDROCHLOROTHIAZIDE 80-12.5 MG PO TABS: 1.0000 | ORAL_TABLET | Freq: Every day | ORAL | 3 refills | Status: DC

## 2022-10-25 NOTE — Progress Notes (Signed)
Subjective:  Katherine Hurst is a 52 y.o. female who presents for Chief Complaint  Patient presents with   Follow-up    1 month follow up. Ran out of bp meds yesterday, called into pharmacy and will be ready today at 4pm. Right leg pain/hip pain when she turns her foot inwards.      Here for follow up from recent well visit  At her well visit a month ago BP was elevated.  We initiated BP medication.  She is doing well with this other than increased urination.   Monitoring BP at home.  Getting 44034, 121/72 and similar.  No other side effects noted  Prediabetes  noted on labs from last visit.   She does endorse unhealthy diet, drinks mountain dew.   Works night shift.  Moves around on the job for exercise at [employer].  Does elliptical every now and then.  Last visit she took the medication for trichomonas noted on pap.    She notes right leg pain, has difficult with internal ROM, been going on for months.   First noticed pain in leg when she went to reach down for a bottle of water yesterday morning,  but been having these pains and ROM limitations for a while now.    No other aggravating or relieving factors.    No other c/o.    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   No current facility-administered medications on file prior to visit.    The following portions of the patient's history were reviewed and updated as appropriate: allergies, current medications, past family history, past medical history, past social history, past surgical history and problem list.  ROS Otherwise as in subjective above    Objective: BP 138/80   Pulse 70   Ht 5\' 5"  (1.651 m)   Wt 263 lb (119.3 kg)   LMP 12/24/2015 Comment: neg preg test 01/07/2016  SpO2 99%   BMI 43.77 kg/m   General appearance: alert, no distress, well developed, well nourished MSK:  mild pain with  right internal hip ROM, tender over IT band and tightness in lower leg with inversion of right foot. Otherwise feet and legs unremarkable Legs neurovascularly intact     Assessment: Encounter Diagnoses  Name Primary?   Essential hypertension, benign Yes   Prediabetes    Trichomonal vaginitis    It band syndrome, right    Right leg pain    Vaccine counseling      Plan: Hypertension  Blood pressure much improved Continue Valsartan HCT 80/12.5mg  daily Limit salt Eat healthy low sugar diet  Get regular exercise Try to lose 20 lb in the next 6 months   Prediabetes means you have a higher than normal blood sugar level. It's not high enough to be considered type 2 diabetes yet, but without making some lifestyle changes you are more likely to develop type 2 diabetes.  If you have prediabetes, the long-term damage of diabetes, especially to your heart, blood vessels and kidneys may already be starting. You may not be able to change certain risk factors such as age, race, or family history, but you CAN make changes to your lifestyle, your eating habits, and your activity.  Although diabetes can develop at any age, the risk of prediabetes increases after age 80.  Your risk of prediabetes increases if  you have a parent or sibling with type 2 diabetes.   Although it's unclear why, certain people including Black, Hispanic, American [place] and [place] American people, are more likely to develop prediabetes.  Ways to prevent or slow progression to diabetes: Eat healthy foods - Eating red meat and processed meat, and drinking sugar-sweetened beverages, is associated with a higher risk of prediabetes. A diet high in fruits, vegetables, nuts, whole grains and olive oil is associated with a lower risk of prediabetes. Get at least 150 minutes of moderate aerobic physical activity a week, or about 30 minutes on most days of the week.  The less active you are, the greater your risk of prediabetes. Physical  activity helps you control your weight, uses up sugar for energy and makes the body use insulin more effectively. Lose excess weight - Being overweight is a primary risk factor for prediabetes. The more fatty tissue you have, especially inside and between the muscle and skin around your abdomen, the more resistant your cells become to insulin. Waist size. A large waist size can indicate insulin resistance. The risk of insulin resistance goes up for men with waists larger than 40 inches and for women with waists larger than 35 inches. Control your blood pressure and cholesterol.  If your blood pressure is not 130/80 or less, discuss with your provider to help get this under control.  It is ideal to have an HDL good cholesterol number >50 and have a LDL bad cholesterol number <100.   Don't smoke One simple strategy to help you make good food choices and eat appropriate portions sizes is to divide up your plate. These three divisions on your plate promote healthy eating:  One-half: fruit and nonstarchy vegetables One-quarter: whole grains One-quarter: protein-rich foods, such as legumes, fish or lean meats   BMI > 40 Work on efforts to lose weight    Right leg pain and reduced ROM  You have tightness and tendnerss in your IT band along your right thigh Use some of the stretches we discussed today including the figure 4 "Rick Flair" stretch. Let me know if you referral to physical therapy  Vaccine counseling Consider coming back soon for Shingrix vaccine    Shamariah was seen today for follow-up.  Diagnoses and all orders for this visit:  Essential hypertension, benign  Prediabetes  Trichomonal vaginitis  It band syndrome, right  Right leg pain  Vaccine counseling  Other orders -     valsartan-hydrochlorothiazide (DIOVAN HCT) 80-12.5 MG tablet; Take 1 tablet by mouth daily.    Follow up: 1 year for well visit

## 2022-10-25 NOTE — Patient Instructions (Signed)
Hypertension  Blood pressure much improved Continue Valsartan HCT 80/12.5mg  daily Limit salt Eat healthy low sugar diet  Get regular exercise Try to lose 20 lb in the next 6 months   Prediabetes means you have a higher than normal blood sugar level. It's not high enough to be considered type 2 diabetes yet, but without making some lifestyle changes you are more likely to develop type 2 diabetes.  If you have prediabetes, the long-term damage of diabetes, especially to your heart, blood vessels and kidneys may already be starting. You may not be able to change certain risk factors such as age, race, or family history, but you CAN make changes to your lifestyle, your eating habits, and your activity.  Although diabetes can develop at any age, the risk of prediabetes increases after age 50.  Your risk of prediabetes increases if you have a parent or sibling with type 2 diabetes.   Although it's unclear why, certain people including Black, Hispanic, American [place] and [place] American people, are more likely to develop prediabetes.  Ways to prevent or slow progression to diabetes: Eat healthy foods - Eating red meat and processed meat, and drinking sugar-sweetened beverages, is associated with a higher risk of prediabetes. A diet high in fruits, vegetables, nuts, whole grains and olive oil is associated with a lower risk of prediabetes. Get at least 150 minutes of moderate aerobic physical activity a week, or about 30 minutes on most days of the week.  The less active you are, the greater your risk of prediabetes. Physical activity helps you control your weight, uses up sugar for energy and makes the body use insulin more effectively. Lose excess weight - Being overweight is a primary risk factor for prediabetes. The more fatty tissue you have, especially inside and between the muscle and skin around your abdomen, the more resistant your cells become to insulin. Waist size. A large waist size can indicate  insulin resistance. The risk of insulin resistance goes up for men with waists larger than 40 inches and for women with waists larger than 35 inches. Control your blood pressure and cholesterol.  If your blood pressure is not 130/80 or less, discuss with your provider to help get this under control.  It is ideal to have an HDL good cholesterol number >50 and have a LDL bad cholesterol number <100.   Don't smoke One simple strategy to help you make good food choices and eat appropriate portions sizes is to divide up your plate. These three divisions on your plate promote healthy eating:  One-half: fruit and nonstarchy vegetables One-quarter: whole grains One-quarter: protein-rich foods, such as legumes, fish or lean meats   BMI > 40 Work on efforts to lose weight    Right leg pain and reduced ROM  You have tightness and tendnerss in your IT band along your right thigh Use some of the stretches we discussed today including the figure 4 "Rick Flair" stretch. Let me know if you referral to physical therapy  Vaccine counseling Consider coming back soon for Shingrix vaccine

## 2022-11-16 ENCOUNTER — Encounter: Payer: Self-pay | Admitting: Internal Medicine

## 2022-12-07 ENCOUNTER — Ambulatory Visit (AMBULATORY_SURGERY_CENTER): Payer: BC Managed Care – PPO | Admitting: *Deleted

## 2022-12-07 VITALS — Ht 65.0 in | Wt 263.0 lb

## 2022-12-07 DIAGNOSIS — Z1211 Encounter for screening for malignant neoplasm of colon: Secondary | ICD-10-CM

## 2022-12-07 HISTORY — PX: COLONOSCOPY: SHX174

## 2022-12-07 MED ORDER — NA SULFATE-K SULFATE-MG SULF 17.5-3.13-1.6 GM/177ML PO SOLN: 1.0000 | Freq: Once | ORAL | 0 refills | Status: AC

## 2022-12-07 NOTE — Progress Notes (Signed)
Pt's name and DOB verified at the beginning of the pre-visit.  Pt denies any difficulty with ambulating,sitting, laying down or rolling side to side Gave both [facility] main # and MD on call # prior to instructions.  No egg or soy allergy known to patient  No issues known to pt with past sedation with any surgeries or procedures Pt denies having issues being intubated Pt has no issues moving head neck or swallowing No FH of Malignant Hyperthermia Pt is not on diet pills Pt is not on home 02  Pt is not on blood thinners  Pt denies issues with constipation  Pt is not on dialysis Pt denise any abnormal heart rhythms  Pt denies any upcoming cardiac testing Pt encouraged to use to use Singlecare or Goodrx to reduce cost  Patient's chart reviewed by Cathlyn Parsons CNRA prior to pre-visit and patient appropriate for the [facility].  Pre-visit completed and red dot placed by patient's name on their procedure day (on provider's schedule).  . Visit by phone Pt states weight is 263 lb Instructed pt why it is important to and  to call if they have any changes in health or new medications. Directed them to the # given and on instructions.   Pt states they will.  Instructions reviewed with pt and pt states understanding. Instructed to review again prior to procedure. Pt states they will.  Instructions sent by mail with coupon and by my chart

## 2022-12-16 ENCOUNTER — Encounter: Payer: Self-pay | Admitting: Internal Medicine

## 2022-12-30 NOTE — Progress Notes (Signed)
[facility] History and Physical   Primary Care Physician:  Jac Canavan, PA-C   Reason for Procedure:  Colon cancer screening  Plan:    Colonoscopy     HPI: Katherine Hurst is a 52 y.o. female with a history of diverticulitis of the sigmoid colon in 2017 (admitted to the hospital).  She presents today for screening colonoscopy.   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 Authorizing Provider  valsartan-hydrochlorothiazide (DIOVAN HCT) 80-12.5 MG tablet Take 1 tablet by mouth daily. 10/25/22 10/25/23  Tysinger, Kermit Balo, PA-C    Current Outpatient Medications  Medication Sig Dispense Refill   valsartan-hydrochlorothiazide (DIOVAN HCT) 80-12.5 MG tablet Take 1 tablet by mouth daily. 90 tablet 3   Current Facility-Administered Medications  Medication Dose Route Frequency Provider Last Rate Last Admin   0.9 %  sodium chloride infusion  500 mL Intravenous Once Iva Boop, MD        Allergies as of 12/31/2022   (No Known Allergies)    Family History  Problem Relation Age of Onset   Diabetes Mother    Hypertension Mother    Stroke Father    Hypertension Father    Aneurysm Father        brain   Diabetes Sister    Asthma Sister    Cancer Neg Hx    Colon cancer Neg Hx    Colon polyps Neg Hx    Esophageal cancer Neg Hx    Rectal cancer Neg Hx    Stomach cancer Neg Hx     Social History   Socioeconomic History   Marital status: Single    Spouse name: Not on  file   Number of children: Not on file   Years of education: Not on file   Highest education level: Not on file  Occupational History   Not on file  Tobacco Use   Smoking status: Never   Smokeless tobacco: Never  Vaping Use   Vaping status: Never Used  Substance and Sexual Activity   Alcohol use: No   Drug use: No   Sexual activity: Yes  Other Topics Concern   Not on file  Social History Narrative   Lives with her son.   Works at [employer] full time, 3rd shift.  No exercise outside of work.  Sleeps all day.  09/2022.     Social Determinants of Health   Financial Resource Strain: Not on file  Food Insecurity: Not on file  Transportation Needs: Not on file  Physical Activity: Not on file  Stress: Not on file  Social Connections: Not on file  Intimate Partner Violence: Not on file    Review of Systems:  All other review of systems negative except as mentioned in the HPI.  Physical Exam: Vital signs BP (!) 146/67   Pulse 75   Temp 98 F (36.7 C)  Resp 16   Ht 5\' 5"  (1.651 m)   Wt 263 lb (119.3 kg)   LMP 12/24/2015 Comment: neg preg test 01/07/2016  SpO2 100%   BMI 43.77 kg/m   General:   Alert,  Well-developed, well-nourished, pleasant and cooperative in NAD Lungs:  Clear throughout to auscultation.   Heart:  Regular rate and rhythm; no murmurs, clicks, rubs,  or gallops. Abdomen:  Soft, nontender and nondistended. Normal bowel sounds.   Neuro/Psych:  Alert and cooperative. Normal mood and affect. A and O x 3   @Karelyn Brisby  Sena Slate, MD, [facility] [phone] (pager) 12/31/2022 1:25 PM@

## 2022-12-31 ENCOUNTER — Ambulatory Visit (AMBULATORY_SURGERY_CENTER): Payer: BC Managed Care – PPO | Admitting: Internal Medicine

## 2022-12-31 ENCOUNTER — Encounter: Payer: Self-pay | Admitting: Internal Medicine

## 2022-12-31 VITALS — BP 112/68 | HR 69 | Temp 98.0°F | Resp 19 | Ht 65.0 in | Wt 263.0 lb

## 2022-12-31 DIAGNOSIS — Z1211 Encounter for screening for malignant neoplasm of colon: Secondary | ICD-10-CM

## 2022-12-31 DIAGNOSIS — D123 Benign neoplasm of transverse colon: Secondary | ICD-10-CM

## 2022-12-31 DIAGNOSIS — Z860101 Personal history of adenomatous and serrated colon polyps: Secondary | ICD-10-CM

## 2022-12-31 HISTORY — DX: Personal history of adenomatous and serrated colon polyps: Z86.0101

## 2022-12-31 MED ORDER — SODIUM CHLORIDE 0.9 % IV SOLN: 500.0000 mL | Freq: Once | INTRAVENOUS | Status: DC

## 2022-12-31 NOTE — Op Note (Signed)
[facility] Patient Name: Katherine Hurst Procedure Date: 12/31/2022 1:15 PM MRN: [id number] Endoscopist: Iva Boop , MD, [id number] Age: 52 Referring MD:  Date of Birth: Oct 19, 1970 Gender: Female Account #: [id number] Procedure:                Colonoscopy Indications:              Screening for colorectal malignant neoplasm, This                            is the patient's first colonoscopy Medicines:                Monitored Anesthesia Care Procedure:                Pre-Anesthesia Assessment:                           - Prior to the procedure, a History and Physical                            was performed, and patient medications and                            allergies were reviewed. The patient's tolerance of                            previous anesthesia was also reviewed. The risks                            and benefits of the procedure and the sedation                            options and risks were discussed with the patient.                            All questions were answered, and informed consent                            was obtained. Prior Anticoagulants: The patient has                            taken no anticoagulant or antiplatelet agents. ASA                            Grade Assessment: III - A patient with severe                            systemic disease. After reviewing the risks and                            benefits, the patient was deemed in satisfactory                            condition to undergo the procedure.  After obtaining informed consent, the colonoscope                            was passed under direct vision. Throughout the                            procedure, the patient's blood pressure, pulse, and                            oxygen saturations were monitored continuously. The                            CF [id number] #[id number] was introduced through the anus                            and advanced to the  the cecum, identified by                            appendiceal orifice and ileocecal valve. The                            colonoscopy was performed without difficulty. The                            patient tolerated the procedure well. The quality                            of the bowel preparation was excellent. The                            ileocecal valve, appendiceal orifice, and rectum                            were photographed. The bowel preparation used was                            SUPREP via split dose instruction. Scope In: 1:28:28 PM Scope Out: 1:41:24 PM Scope Withdrawal Time: 0 hours 10 minutes 30 seconds  Total Procedure Duration: 0 hours 12 minutes 56 seconds  Findings:                 The perianal and digital rectal examinations were                            normal.                           A diminutive polyp was found in the distal                            transverse colon. The polyp was sessile. The polyp                            was removed with a cold snare. Resection and  retrieval were complete. Verification of patient                            identification for the specimen was done. Estimated                            blood loss was minimal.                           Multiple diverticula were found in the sigmoid                            colon, descending colon and transverse colon.                           Internal hemorrhoids were found.                           The exam was otherwise without abnormality on                            direct and retroflexion views. Complications:            No immediate complications. Estimated Blood Loss:     Estimated blood loss was minimal. Impression:               - One diminutive polyp in the distal transverse                            colon, removed with a cold snare. Resected and                            retrieved.                           - Diverticulosis in the sigmoid  colon, in the                            descending colon and in the transverse colon.                           - Internal hemorrhoids.                           - The examination was otherwise normal on direct                            and retroflexion views. Recommendation:           - Patient has a contact number available for                            emergencies. The signs and symptoms of potential                            delayed complications were discussed with the  patient. Return to normal activities tomorrow.                            Written discharge instructions were provided to the                            patient.                           - Resume previous diet.                           - Continue present medications.                           - Repeat colonoscopy is recommended. The                            colonoscopy date will be determined after pathology                            results from today's exam become available for                            review. Iva Boop, MD 12/31/2022 1:49:03 PM This report has been signed electronically.

## 2022-12-31 NOTE — Progress Notes (Signed)
Pt's states no medical or surgical changes since previsit or office visit. 

## 2022-12-31 NOTE — Progress Notes (Signed)
Vss nad trans no pacu

## 2022-12-31 NOTE — Patient Instructions (Addendum)
There was a tiny polyp seen and removed so it will not grow into cancer.  I will let you know pathology results and when to have another routine colonoscopy by mail and/or My Chart.  You also have a condition called diverticulosis - common and not usually a problem. Please read the handout provided. Hemorrhoids are also swollen.  I appreciate the opportunity to care for you. Iva Boop, MD, [facility]  Resume previous diet Continue present medications Await pathology results  Handouts/information given for polyps, diverticulosis and hemorrhoids  YOU HAD AN ENDOSCOPIC PROCEDURE TODAY AT THE [facility]:   Refer to the procedure report that was given to you for any specific questions about what was found during the examination.  If the procedure report does not answer your questions, please call your gastroenterologist to clarify.  If you requested that your care partner not be given the details of your procedure findings, then the procedure report has been included in a sealed envelope for you to review at your convenience later.  YOU SHOULD EXPECT: Some feelings of bloating in the abdomen. Passage of more gas than usual.  Walking can help get rid of the air that was put into your GI tract during the procedure and reduce the bloating. If you had a lower endoscopy (such as a colonoscopy or flexible sigmoidoscopy) you may notice spotting of blood in your stool or on the toilet paper. If you underwent a bowel prep for your procedure, you may not have a normal bowel movement for a few days.  Please Note:  You might notice some irritation and congestion in your nose or some drainage.  This is from the oxygen used during your procedure.  There is no need for concern and it should clear up in a day or so.  SYMPTOMS TO REPORT IMMEDIATELY:  Following lower endoscopy (colonoscopy):  Excessive amounts of blood in the stool  Significant tenderness or worsening of abdominal pains  Swelling  of the abdomen that is new, acute  Fever of 100F or higher  For urgent or emergent issues, a gastroenterologist can be reached at any hour by calling [phone]. Do not use MyChart messaging for urgent concerns.   DIET:  We do recommend a small meal at first, but then you may proceed to your regular diet.  Drink plenty of fluids but you should avoid alcoholic beverages for 24 hours.  ACTIVITY:  You should plan to take it easy for the rest of today and you should NOT DRIVE or use heavy machinery until tomorrow (because of the sedation medicines used during the test).    FOLLOW UP: Our staff will call the number listed on your records the next business day following your procedure.  We will call around 7:15- 8:00 am to check on you and address any questions or concerns that you may have regarding the information given to you following your procedure. If we do not reach you, we will leave a message.     If any biopsies were taken you will be contacted by phone or by letter within the next 1-3 weeks.  Please call us at [phone] if you have not heard about the biopsies in 3 weeks.   SIGNATURES/CONFIDENTIALITY: You and/or your care partner have signed paperwork which will be entered into your electronic medical record.  These signatures attest to the fact that that the information above on your After Visit Summary has been reviewed and is understood.  Full responsibility of  the confidentiality of this discharge information lies with you and/or your care-partner.

## 2023-01-03 ENCOUNTER — Telehealth: Payer: Self-pay

## 2023-01-03 NOTE — Telephone Encounter (Signed)
  Follow up Call-     12/31/2022    1:06 PM  Call back number  Post procedure Call Back phone  # [phone]  Permission to leave phone message Yes     Patient questions:  Do you have a fever, pain , or abdominal swelling? No. Pain Score  0 *  Have you tolerated food without any problems? Yes.    Have you been able to return to your normal activities? Yes.    Do you have any questions about your discharge instructions: Diet   No. Medications  No. Follow up visit  No.  Do you have questions or concerns about your Care? No.  Actions: * If pain score is 4 or above: No action needed, pain <4.

## 2023-01-05 LAB — SURGICAL PATHOLOGY

## 2023-01-07 ENCOUNTER — Encounter: Payer: Self-pay | Admitting: Internal Medicine

## 2023-01-07 DIAGNOSIS — Z860101 Personal history of adenomatous and serrated colon polyps: Secondary | ICD-10-CM

## 2023-08-27 ENCOUNTER — Ambulatory Visit (HOSPITAL_COMMUNITY)
Admission: EM | Admit: 2023-08-27 | Discharge: 2023-08-27 | Disposition: A | Discharge status: Home / Self Care | Attending: Emergency Medicine | Admitting: Emergency Medicine

## 2023-08-27 ENCOUNTER — Ambulatory Visit (INDEPENDENT_AMBULATORY_CARE_PROVIDER_SITE_OTHER)

## 2023-08-27 ENCOUNTER — Encounter (HOSPITAL_COMMUNITY): Payer: Self-pay | Admitting: Emergency Medicine

## 2023-08-27 ENCOUNTER — Other Ambulatory Visit: Payer: Self-pay

## 2023-08-27 DIAGNOSIS — M25562 Pain in left knee: Secondary | ICD-10-CM

## 2023-08-27 DIAGNOSIS — M25462 Effusion, left knee: Secondary | ICD-10-CM

## 2023-08-27 DIAGNOSIS — M25469 Effusion, unspecified knee: Secondary | ICD-10-CM

## 2023-08-27 NOTE — ED Provider Notes (Signed)
 [facility]    CSN: [id number] Arrival date & time: 08/27/23  1541      History   Chief Complaint Chief Complaint  Patient presents with   Knee Pain    HPI Katherine Hurst is a 53 y.o. female. L knee  2 days ago. No fall or injury. Stands a lot at work. Reduced ROM both flex/extend. Slightly warm to touch c/w R. Slightly swelling c/w R. 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   Sickle cell trait (HCC)    Wears glasses     Patient Active Problem List   Diagnosis Date Noted   Hx of adenomatous polyp of colon 12/31/2022   Secondary hypertension 10/25/2022   Essential hypertension, benign 10/25/2022   Prediabetes 10/25/2022   It band syndrome, right 10/25/2022   Right leg pain 10/25/2022   Annual physical exam 09/21/2022   Screening for colon cancer 09/21/2022   Screening for cervical cancer 09/21/2022   Vaccine counseling 09/21/2022   Screening for diabetes mellitus 09/21/2022   Screening for lipid disorders 09/21/2022   Elevated blood pressure reading in office without diagnosis of hypertension 09/21/2022   Partial deafness of left ear 09/21/2022   Encounter for screening mammogram for malignant neoplasm of breast 09/21/2022   Acute diverticulitis 01/07/2016   Obesity (BMI 30-39.9) 01/07/2016   Hypokalemia 01/07/2016   Cyst of skin 04/26/2012    Past Surgical History:  Procedure Laterality Date   CESAREAN SECTION     EAR CYST EXCISION N/A 04/26/2012   Procedure: EXCISION OF SCALP CYST;  Surgeon: Estefana Reichert, DO;  Location: [facility];  Service: Plastics;  Laterality: N/A;   TUBAL LIGATION     13 years ago    OB History   No obstetric history on file.      Home Medications    Prior to Admission medications   Medication Sig  Start Date End Date Taking? Authorizing Provider  valsartan -hydrochlorothiazide  (DIOVAN  HCT) 80-12.5 MG tablet Take 1 tablet by mouth daily. 10/25/22 10/25/23  Tysinger, Alm RAMAN, PA-C    Family History Family History  Problem Relation Age of Onset   Diabetes Mother    Hypertension Mother    Stroke Father    Hypertension Father    Aneurysm Father        brain   Diabetes Sister    Asthma Sister    Cancer Neg Hx    Colon cancer Neg Hx    Colon polyps Neg Hx    Esophageal cancer Neg Hx    Rectal cancer Neg Hx    Stomach cancer Neg Hx     Social History Social History   Tobacco Use   Smoking status: Never   Smokeless tobacco: Never  Vaping Use   Vaping status: Never Used  Substance Use Topics   Alcohol use: No   Drug use: No     Allergies   Patient has no known allergies.   Review of Systems Review of Systems   Physical Exam Triage Vital Signs ED Triage Vitals  Encounter Vitals Group     BP 08/27/23 1655 (!) 152/72     Girls Systolic BP Percentile --      Girls Diastolic BP Percentile --  Boys Systolic BP Percentile --      Boys Diastolic BP Percentile --      Pulse Rate 08/27/23 1655 66     Resp --      Temp 08/27/23 1655 98.3 F (36.8 C)     Temp Source 08/27/23 1655 Oral     SpO2 08/27/23 1655 100 %     Weight --      Height --      Head Circumference --      Peak Flow --      Pain Score 08/27/23 1653 5     Pain Loc --      Pain Education --      Exclude from Growth Chart --    No data found.  Updated Vital Signs BP (!) 152/72 (BP Location: Right Arm)   Pulse 66   Temp 98.3 F (36.8 C) (Oral)   LMP 12/24/2015 Comment: neg preg test 01/07/2016  SpO2 100%   Visual Acuity Right Eye Distance:   Left Eye Distance:   Bilateral Distance:    Right Eye Near:   Left Eye Near:    Bilateral Near:     Physical Exam   UC Treatments / Results  Labs (all labs ordered are listed, but only abnormal results are displayed) Labs Reviewed - No  data to display  EKG   Radiology No results found.  Procedures Procedures (including critical care time)  Medications Ordered in UC Medications - No data to display  Initial Impression / Assessment and Plan / UC Course  I have reviewed the triage vital signs and the nursing notes.  Pertinent labs & imaging results that were available during my care of the patient were reviewed by me and considered in my medical decision making (see chart for details).     *** Final Clinical Impressions(s) / UC Diagnoses   Final diagnoses:  Knee swelling  Acute pain of left knee     Discharge Instructions      Use ice or heat therapy   Use tylenol  or ibuprofen  as directed on the package for pain.  Contact sports medicine Monday morning to arrange an appointment    ED Prescriptions   None    PDMP not reviewed this encounter.

## 2023-08-27 NOTE — Discharge Instructions (Signed)
 Use ice or heat therapy   Use tylenol  or ibuprofen  as directed on the package for pain.  Contact sports medicine Monday morning to arrange an appointment

## 2023-08-27 NOTE — ED Triage Notes (Signed)
 Patient reports pain above left knee.  Feels a pulling sensation behind knee/calf.  Symptoms for 2 days.  Patient does work on concrete floors daily.    Patient has not had any medications for symptoms.  Patient denies any fall

## 2023-09-11 ENCOUNTER — Emergency Department (HOSPITAL_COMMUNITY)
Admission: EM | Admit: 2023-09-11 | Discharge: 2023-09-11 | Disposition: A | Discharge status: Home / Self Care | Attending: Emergency Medicine | Admitting: Emergency Medicine

## 2023-09-11 ENCOUNTER — Other Ambulatory Visit: Payer: Self-pay

## 2023-09-11 ENCOUNTER — Emergency Department (HOSPITAL_COMMUNITY)

## 2023-09-11 DIAGNOSIS — M25562 Pain in left knee: Secondary | ICD-10-CM | POA: Insufficient documentation

## 2023-09-11 DIAGNOSIS — M25462 Effusion, left knee: Secondary | ICD-10-CM | POA: Diagnosis not present

## 2023-09-11 MED ORDER — METHYLPREDNISOLONE 4 MG PO TBPK: ORAL_TABLET | ORAL | 0 refills | Status: DC

## 2023-09-11 NOTE — ED Notes (Signed)
 Discharge instructions reviewed with patient. Patient questions answered and opportunity for education reviewed. Patient voices understanding of discharge instructions with no further questions. Patient ambulatory with steady gait to lobby.

## 2023-09-11 NOTE — Discharge Instructions (Signed)
 There is swelling in your left knee.  There is fluid there.  Follow-up with sports medicine as planned.  We will give some steroids to help with the swelling and pain.  Use the knee immobilizer as needed for comfort.

## 2023-09-11 NOTE — ED Triage Notes (Signed)
 Pt states that about 3 weeks ago, she started experiencing left knee pain that has gotten progressively worse. Pt was seen at an urgent care 2 weeks ago and has not gotten any updates. Pt walks with a limp and states that the pain is located behind her knee.

## 2023-09-11 NOTE — Progress Notes (Signed)
 Orthopedic Tech Progress Note Patient Details:  Katherine Hurst 04/24/1970 [id number]  Ortho Devices Type of Ortho Device: Knee Immobilizer Ortho Device/Splint Location: LLE Ortho Device/Splint Interventions: Ordered, Application, Adjustment   Post Interventions Patient Tolerated: Well Instructions Provided: Adjustment of device, Care of device  Adine MARLA Blush 09/11/2023, 6:50 PM

## 2023-09-11 NOTE — ED Provider Notes (Signed)
  [facility] AT [facility] Provider Note   CSN: [id number] Arrival date & time: 09/11/23  1438     Patient presents with: Knee Pain   Katherine Hurst is a 53 y.o. female.    Knee Pain Patient has had about 3 weeks of left knee pain.  No injury.  Worse with movement.  Has gotten worse.  Seen at urgent care.  Had been given instructions for Motrin  and Tylenol  and sports medicine follow-up.  States she is due to see [facility] who her son has seen.  States pain feels if it is behind the knee.  No fevers or chills.  No injury at the beginning.     Prior to Admission medications   Medication Sig Start Date End Date Taking? Authorizing Provider  methylPREDNISolone  (MEDROL  DOSEPAK) 4 MG TBPK tablet Use per package directions 09/11/23  Yes Patsey Lot, MD  valsartan -hydrochlorothiazide  (DIOVAN  HCT) 80-12.5 MG tablet Take 1 tablet by mouth daily. 10/25/22 10/25/23  Tysinger, Alm RAMAN, PA-C    Allergies: Patient has no known allergies.    Review of Systems  Updated Vital Signs BP (!) 175/91   Pulse 95   Temp 98.9 F (37.2 C) (Oral)   Wt 117.9 kg   LMP 12/24/2015 Comment: neg preg test 01/07/2016  SpO2 98%   BMI 43.27 kg/m   Physical Exam Vitals and nursing note reviewed.  Constitutional:      Appearance: She is obese.  Musculoskeletal:     Comments: Left knee with decreased range of motion.  Does have effusion.  No erythema.  Skin:    General: Skin is warm.  Neurological:     Mental Status: She is oriented to person, place, and time.     (all labs ordered are listed, but only abnormal results are displayed) Labs Reviewed - No data to display  EKG: None  Radiology: DG Knee 2 Views Left Result Date: 09/11/2023 CLINICAL DATA:  Pain EXAM: LEFT KNEE - 1-2 VIEW COMPARISON:  08/27/2023 FINDINGS: No fracture or malalignment. Positive for knee effusion. Joint spaces appear patent IMPRESSION: No acute osseous abnormality. Knee effusion.  Electronically Signed   By: Luke Bun M.D.   On: 09/11/2023 16:52     Procedures   Medications Ordered in the ED - No data to display                                  Medical Decision Making Amount and/or Complexity of Data Reviewed Radiology: ordered.  Risk Prescription drug management.   Patient with left knee pain.  Differential diagnosis along with includes causes such as gout effusion nonspecific intra knee pain.  Does have effusion.  X-ray does not show acute abnormality besides the effusion.  Discussed with patient about possible arthrocentesis.  Patient declines at this time.  Will treat with steroids to help with symptoms.  Also will give knee immobilizer.  States she has plans to see sports medicine.  Will discharge to follow-up with them.     Final diagnoses:  Acute pain of left knee    ED Discharge Orders          Ordered    methylPREDNISolone  (MEDROL  DOSEPAK) 4 MG TBPK tablet        09/11/23 1843               Patsey Lot, MD 09/11/23 1847

## 2023-09-16 ENCOUNTER — Ambulatory Visit: Payer: Self-pay

## 2023-09-16 ENCOUNTER — Ambulatory Visit: Admitting: Sports Medicine

## 2023-09-16 VITALS — HR 73 | Ht 65.0 in | Wt 260.0 lb

## 2023-09-16 DIAGNOSIS — M25462 Effusion, left knee: Secondary | ICD-10-CM

## 2023-09-16 DIAGNOSIS — M25562 Pain in left knee: Secondary | ICD-10-CM | POA: Diagnosis not present

## 2023-09-16 MED ORDER — MELOXICAM 15 MG PO TABS: 15.0000 mg | ORAL_TABLET | Freq: Every day | ORAL | 0 refills | Status: DC

## 2023-09-16 NOTE — Patient Instructions (Addendum)
 Knee HEP  - Start meloxicam  15 mg daily x2 weeks.  If still having pain after 2 weeks, complete 3rd-week of NSAID. May use remaining NSAID as needed once daily for pain control.  Do not to use additional over-the-counter NSAIDs (ibuprofen , naproxen, Advil , Aleve, etc.) while taking prescription NSAIDs.  May use Tylenol  [phone] mg 2 to 3 times a day for breakthrough pain. STARTING ON MONDAY  Finish prednisone pak  3-4 week follow up

## 2023-09-16 NOTE — Progress Notes (Signed)
 [facility] [address] [place] [address] Phone: [phone]   Assessment and Plan:     1. Left knee pain, acute chronicity 2. Effusion of left knee -Acute, initial sports medicine visit - Left knee pain with resolving effusion occurring over the past 3 to 4 weeks without specific MOI.  Consistent with flare of intra-articular pathology, however unremarkable physical exam, and no significant degenerative changes seen on x-ray. - Reassuring that patient has had significant improvement in symptoms with prednisone course.  Recommend completing prednisone course - After completing prednisone course, Start meloxicam  15 mg daily x2 weeks.  If still having pain after 2 weeks, complete 3rd-week of NSAID. May use remaining NSAID as needed once daily for pain control.  Do not to use additional over-the-counter NSAIDs (ibuprofen , naproxen, Advil , Aleve, etc.) while taking prescription NSAIDs.  May use Tylenol  [phone] mg 2 to 3 times a day for breakthrough pain.  -Start HEP for knee  15 additional minutes spent for educating Therapeutic Home Exercise Program.  This included exercises focusing on stretching, strengthening, with focus on eccentric aspects.   Long term goals include an improvement in range of motion, strength, endurance as well as avoiding reinjury. Patient's frequency would include in 1-2 times a day, 3-5 times a week for a duration of 6-12 weeks. Proper technique shown and discussed handout in great detail with ATC.  All questions were discussed and answered.  Sports Medicine: Musculoskeletal Ultrasound. Exam: Limited US  of left knee Diagnosis: Left knee effusion  US  Findings: Mild intra-articular effusion based on fluid seen in suprapatellar space.  Otherwise unremarkable structures   Images permanently stored.  US  Impression:  Mild knee effusion  Pertinent previous records reviewed include urgent care note 08/27/2023, ER  note 09/11/2023, knee x-ray 08/27/2023, knee x-ray 09/11/2023,  Follow Up: 3 to 4 weeks for reevaluation.  If no improvement or worsening of symptoms, could consider physical therapy versus CSI versus advanced imaging   Subjective:   I, Katherine Hurst, am serving as a Neurosurgeon for Doctor Katherine Hurst  Chief Complaint: left knee pain   HPI:   09/11/2023 Was seen in the ED Patient has had about 3 weeks of left knee pain.  No injury.  Worse with movement.  Has gotten worse.  Seen at urgent care.  Had been given instructions for Motrin  and Tylenol  and sports medicine follow-up.  States she is due to see [facility] who her son has seen.  States pain feels if it is behind the knee.  No fevers or chills.  No injury at the beginning.   09/16/23 Patient is a 53 year old female with left knee pain. Patient states 3 weeks ago she had decreased ROM. She has been taking prednisone and that has helped. Pain is all in the back of the knee. Pain when walking has resolved. Still has decreased ROM is not able to squat. No numbness or tingling    Relevant Historical Information: Hypertension  Additional pertinent review of systems negative.   Current Outpatient Medications:    meloxicam  (MOBIC ) 15 MG tablet, Take 1 tablet (15 mg total) by mouth daily., Disp: 30 tablet, Rfl: 0   methylPREDNISolone  (MEDROL  DOSEPAK) 4 MG TBPK tablet, Use per package directions, Disp: 21 each, Rfl: 0   valsartan -hydrochlorothiazide  (DIOVAN  HCT) 80-12.5 MG tablet, Take 1 tablet by mouth daily., Disp: 90 tablet, Rfl: 3   Objective:     Vitals:   09/16/23 0802  Pulse:  73  SpO2: 98%  Weight: 260 lb (117.9 kg)  Height: 5' 5 (1.651 m)      Body mass index is 43.27 kg/m.    Physical Exam:    General:  awake, alert oriented, no acute distress nontoxic Skin: no suspicious lesions or rashes Neuro:sensation intact and strength 5/5 with no deficits, no atrophy, normal muscle tone Psych: No signs of anxiety,  depression or other mood disorder  Left knee:   swelling difficult to assess due to body habitus, but no significant difference bilaterally No deformity Neg fluid wave, joint milking ROM Flex 105, Ext 0 NTTP over the quad tendon, medial fem condyle, lat fem condyle, patella, plica, patella tendon, tibial tuberostiy, fibular head, posterior fossa, pes anserine bursa, gerdy's tubercle, medial jt line, lateral jt line Neg anterior and posterior drawer Neg lachman Neg sag sign Negative varus stress Negative valgus stress Negative McMurray Gait normal    Electronically signed by:  Odis Hurst D.CLEMENTEEN [facility] 8:45 AM 09/16/23

## 2023-09-20 ENCOUNTER — Ambulatory Visit: Admitting: Sports Medicine

## 2023-10-06 NOTE — Progress Notes (Deleted)
    Ben Jackson D[facility] [address] [place] [address] Phone: [phone]   Assessment and Plan:     There are no diagnoses linked to this encounter.  ***   Pertinent previous records reviewed include ***    Follow Up: ***     Subjective:   I, Verline Kong, am serving as a Neurosurgeon for Doctor Morene Mace   Chief Complaint: left knee pain    HPI:    09/11/2023 Was seen in the ED Patient has had about 3 weeks of left knee pain.  No injury.  Worse with movement.  Has gotten worse.  Seen at urgent care.  Had been given instructions for Motrin  and Tylenol  and sports medicine follow-up.  States she is due to see [place] sports medicine who her son has seen.  States pain feels if it is behind the knee.  No fevers or chills.  No injury at the beginning.     09/16/23 Patient is a 53 year old female with left knee pain. Patient states 3 weeks ago she had decreased ROM. She has been taking prednisone and that has helped. Pain is all in the back of the knee. Pain when walking has resolved. Still has decreased ROM is not able to squat. No numbness or tingling    10/07/2023 Patient states   Relevant Historical Information: Hypertension   Additional pertinent review of systems negative.   Current Outpatient Medications:    meloxicam  (MOBIC ) 15 MG tablet, Take 1 tablet (15 mg total) by mouth daily., Disp: 30 tablet, Rfl: 0   methylPREDNISolone  (MEDROL  DOSEPAK) 4 MG TBPK tablet, Use per package directions, Disp: 21 each, Rfl: 0   valsartan -hydrochlorothiazide  (DIOVAN  HCT) 80-12.5 MG tablet, Take 1 tablet by mouth daily., Disp: 90 tablet, Rfl: 3   Objective:     There were no vitals filed for this visit.    There is no height or weight on file to calculate BMI.    Physical Exam:    ***   Electronically signed by:  Odis Mace D[facility] 7:50 AM 10/06/23

## 2023-10-07 ENCOUNTER — Ambulatory Visit: Admitting: Sports Medicine

## 2023-10-10 ENCOUNTER — Other Ambulatory Visit: Payer: Self-pay | Admitting: Sports Medicine

## 2023-11-08 ENCOUNTER — Encounter: Payer: Self-pay | Admitting: Sports Medicine

## 2023-11-26 ENCOUNTER — Other Ambulatory Visit: Payer: Self-pay | Admitting: Medical

## 2023-11-28 NOTE — Telephone Encounter (Signed)
 Patient is overdue for a visit. Please schedule. Once scheduled I can refill medication

## 2023-12-13 ENCOUNTER — Encounter: Payer: Self-pay | Admitting: Medical

## 2023-12-13 ENCOUNTER — Ambulatory Visit: Admitting: Medical

## 2023-12-13 VITALS — BP 138/72 | HR 77 | Ht 65.5 in | Wt 258.2 lb

## 2023-12-13 DIAGNOSIS — Z13220 Encounter for screening for lipoid disorders: Secondary | ICD-10-CM | POA: Insufficient documentation

## 2023-12-13 DIAGNOSIS — Z1231 Encounter for screening mammogram for malignant neoplasm of breast: Secondary | ICD-10-CM | POA: Diagnosis not present

## 2023-12-13 DIAGNOSIS — Z6841 Body Mass Index (BMI) 40.0 and over, adult: Secondary | ICD-10-CM

## 2023-12-13 DIAGNOSIS — Z136 Encounter for screening for cardiovascular disorders: Secondary | ICD-10-CM

## 2023-12-13 DIAGNOSIS — M25462 Effusion, left knee: Secondary | ICD-10-CM | POA: Insufficient documentation

## 2023-12-13 DIAGNOSIS — R7303 Prediabetes: Secondary | ICD-10-CM

## 2023-12-13 DIAGNOSIS — Z0000 Encounter for general adult medical examination without abnormal findings: Secondary | ICD-10-CM | POA: Diagnosis not present

## 2023-12-13 DIAGNOSIS — Z7185 Encounter for immunization safety counseling: Secondary | ICD-10-CM | POA: Diagnosis not present

## 2023-12-13 DIAGNOSIS — Z1389 Encounter for screening for other disorder: Secondary | ICD-10-CM

## 2023-12-13 DIAGNOSIS — M25562 Pain in left knee: Secondary | ICD-10-CM

## 2023-12-13 LAB — LIPID PANEL

## 2023-12-13 NOTE — Patient Instructions (Addendum)
 Begin supplement Glucosamine Chondroitin over the counter daily Consider fish oil supplement over the counter daily as well You can use Aleve over the counter some on days that it is worse Consider a knee sleeve  Follow up with sports medicine soon   Please call to schedule your mammogram.   [facility]  [phone] [address] [place], [place] [address]

## 2023-12-13 NOTE — Progress Notes (Signed)
 Name: Katherine Hurst   Date of Visit: 12/13/23   Date of last visit with me: 11/26/2023   CHIEF COMPLAINT:  Chief Complaint  Patient presents with   Annual Exam    Fasting cpe, had a sip of mountain dew to take medication with. Left knee pain x 6 months. Declines all shots today       HPI:  Discussed the use of AI scribe software for clinical note transcription with the patient, who gave verbal consent to proceed.  History of Present Illness   Katherine Hurst is a 53 year old female who presents for an annual physical exam.  She has a history of hypertension, currently managed with valsartan  HCT 80/12.5 mg.  She experiences swelling and stiffness in her left knee, ongoing for several months. She previously visited the emergency room for a hip problem 3-4 months ago and was prescribed prednisone, which alleviated her symptoms and allowed her to walk without difficulty. However, a subsequent visit to a sports doctor and the use of prescribed pain medication did not provide relief. X-rays of the knee did not reveal any abnormalities, but an ultrasound showed swelling. She reports difficulty with mobility, stating 'I can't run' and 'I can't skip'.  Her last Pap smear was a year ago, with negative results for HPV and cancer cells. Blood work from July of last year showed good liver, kidney, and electrolyte levels, with a borderline diabetes risk indicated by her sugar levels. Cholesterol levels were satisfactory.  Her past surgical history includes tubal ligation, C-section, and ear cyst removal. She denies any family history of cancer and reports no new family health issues in the past year.  She does not smoke, consume alcohol, or use drugs. She lives with her son and works third shift at [employer], where she engages in physical activity, including walking and warming up at the start of her shift.      Reviewed their medical, surgical, family, social, medication, and allergy history and  updated chart as appropriate.  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   Sickle cell trait    Wears glasses      Current Outpatient Medications:    valsartan -hydrochlorothiazide  (DIOVAN -HCT) 80-12.5 MG tablet, Take 1 tablet by mouth once daily, Disp: 30 tablet, Rfl: 0  Family History  Problem Relation Age of Onset   Diabetes Mother    Hypertension Mother    Stroke Father    Hypertension Father    Aneurysm Father        brain   Diabetes Sister    Asthma Sister    Cancer Neg Hx    Colon cancer Neg Hx    Colon polyps Neg Hx    Esophageal cancer Neg Hx    Rectal cancer Neg Hx    Stomach cancer Neg Hx     Past Surgical History:  Procedure Laterality Date   CESAREAN SECTION     COLONOSCOPY  12/2022   polyps, repeat 10 years, Dr. Lupita Commander   EAR CYST EXCISION N/A 04/26/2012   Procedure: EXCISION OF SCALP CYST;  Surgeon: Estefana Reichert, DO;  Location: [facility];  Service: Plastics;  Laterality: N/A;   TUBAL LIGATION     13 years ago  Review of Systems  Constitutional:  Negative for chills, fever, malaise/fatigue and weight loss.  HENT:  Negative for congestion, ear pain, hearing loss, sore throat and tinnitus.   Eyes:  Negative for blurred vision, pain and redness.  Respiratory:  Negative for cough, hemoptysis and shortness of breath.   Cardiovascular:  Negative for chest pain, palpitations, orthopnea, claudication and leg swelling.  Gastrointestinal:  Negative for abdominal pain, blood in stool, constipation, diarrhea, nausea and vomiting.  Genitourinary:  Negative for dysuria, flank pain, frequency, hematuria and urgency.  Musculoskeletal:  Positive for joint pain. Negative for falls and myalgias.  Skin:  Negative for itching and rash.  Neurological:   Negative for dizziness, tingling, speech change, weakness and headaches.  Endo/Heme/Allergies:  Negative for polydipsia. Does not bruise/bleed easily.  Psychiatric/Behavioral:  Negative for depression and memory loss. The patient is not nervous/anxious and does not have insomnia.      OBJECTIVE:    BP 138/72   Pulse 77   Ht 5' 5.5 (1.664 m)   Wt 258 lb 3.2 oz (117.1 kg)   LMP 12/24/2015 Comment: neg preg test 01/07/2016  SpO2 98%   BMI 42.31 kg/m   BP Readings from Last 3 Encounters:  12/13/23 138/72  09/11/23 (!) 161/85  08/27/23 (!) 152/72    Wt Readings from Last 3 Encounters:  12/13/23 258 lb 3.2 oz (117.1 kg)  09/16/23 260 lb (117.9 kg)  09/11/23 260 lb (117.9 kg)    Physical Exam   General appearance: alert, no distress, WD/WN, African American female Skin: unremarkable HEENT: normocephalic, conjunctiva/corneas normal, sclerae anicteric, PERRLA, EOMi, nares patent, no discharge or erythema, pharynx normal Oral cavity: MMM, tongue normal Neck: supple, no lymphadenopathy, no thyromegaly, no masses, normal ROM, no bruits Chest: non tender, normal shape and expansion Heart: RRR, normal S1, S2, no murmurs Lungs: CTA bilaterally, no wheezes, rhonchi, or rales Abdomen: +bs, soft, non tender, non distended, no masses, no hepatomegaly, no splenomegaly, no bruits Back: non tender, normal ROM, no scoliosis Musculoskeletal: right knee with mild effusion, otherwise no obvious laxity, upper extremities non tender, no obvious deformity, normal ROM throughout, lower extremities non tender, no obvious deformity, normal ROM throughout Extremities: no edema, no cyanosis, no clubbing Pulses: 2+ symmetric, upper and lower extremities, normal cap refill Neurological: alert, oriented x 3, CN2-12 intact, strength normal upper extremities and lower extremities, sensation normal throughout, DTRs 2+ throughout, no cerebellar signs, gait normal Psychiatric: normal affect, behavior normal,  pleasant  Breast/gyn - deferred    ASSESSMENT/PLAN:   Encounter Diagnoses  Name Primary?   Encounter for health maintenance examination in adult Yes   Vaccine counseling    Encounter for lipid screening for cardiovascular disease    Screening mammogram for breast cancer    Screening for hematuria or proteinuria    Prediabetes    Pain and swelling of left knee    BMI 40.0-44.9, adult (HCC)      Separate significant issues discussed: Left knee pain and swelling-follow-up with sports medicine  Hypertension-continue valsartan  HCT 80/12.5 mg daily  Prediabetes-update labs today  Obesity-work on efforts to lose weight through healthy diet and exercise   This visit was a preventative care visit, also known as wellness visit or routine physical.   Topics typically include healthy lifestyle, diet, exercise, preventative care, vaccinations, sick and well care, proper use of emergency dept and after hours care, as well as other concerns.     General Recommendations: Continue to return yearly for your annual wellness and  preventative care visits.  This gives us  a chance to discuss healthy lifestyle, exercise, vaccinations, review your chart record, and perform screenings where appropriate.  I recommend you see your eye doctor yearly for routine vision care.  I recommend you see your dentist yearly for routine dental care including hygiene visits twice yearly.   Vaccination  Immunization History  Administered Date(s) Administered   DTaP 02/19/1969, 04/01/1969, 06/18/1969, 11/20/1970, 01/03/1975   Hepatitis B 12/21/2016   Hepatitis B, ADULT 08/31/2015   IPV 02/19/1969, 06/18/1969, 05/08/1970, 11/20/1970   MMR 02/06/1970   Meningococcal Conjugate 01/03/1975   Tdap 08/31/2015    Vaccine recommendations: Flu, pneumococcal, covid, shingles  Vaccines administered today: None, declines   Screening for cancer: Colon cancer screening: Prior or last colon cancer  screen: Reviewed 12/2022 report  Breast cancer screening Call and schedule mammogram  Cervical cancer screening Pap up to date 2024   Skin cancer screening: Check your skin regularly for new changes, growing lesions, or other lesions of concern Come in for evaluation if you have skin lesions of concern.   Lung cancer screening: If you have a greater than 20 pack year history of tobacco use, then you may qualify for lung cancer screening with a chest CT scan.   Please call your insurance company to inquire about coverage for this test.   Pancreatic cancer:  no current screening test is available or routinely recommended. (risk factors: smoking, overweight or obese, diabetes, chronic pancreatitis, work exposure - dry cleaning, metal working, 55yo>, M>F, Tree surgeon, family hx/o, hereditary breast, ovarian, melanoma, lynch, peutz-jeghers).  Symptoms: jaundice, dark urine, light color or greasy stools, itchy skin, belly or back pain, weight loss, poor appetite, nausea, vomiting, liver enlargement, DVT/blood clots.   We currently don't have screenings for other cancers besides breast, cervical, colon, and lung cancers.  If you have a strong family history of cancer or have other cancer screening concerns, please let me know.  Genetic testing referral is an option for individuals with high cancer risk in the family.  There are some other cancer screenings in development currently.   Bone health: Get at least 150 minutes of aerobic exercise weekly Get weight bearing exercise at least once weekly Bone density test:  A bone density test is an imaging test that uses a type of X-ray to measure the amount of calcium and other minerals in your bones. The test may be used to diagnose or screen you for a condition that causes weak or thin bones (osteoporosis), predict your risk for a broken bone (fracture), or determine how well your osteoporosis treatment is working. The bone density test is  recommended for females 65 and older, or females or males <65 if certain risk factors such as thyroid  disease, long term use of steroids such as for asthma or rheumatological issues, vitamin D deficiency, estrogen deficiency, family history of osteoporosis, self or family history of fragility fracture in first degree relative.    Heart health: Get at least 150 minutes of aerobic exercise weekly Limit alcohol It is important to maintain a healthy blood pressure and healthy cholesterol numbers  Heart disease screening: Screening for heart disease includes screening for blood pressure, fasting lipids, glucose/diabetes screening, BMI height to weight ratio, reviewed of smoking status, physical activity, and diet.    Goals include blood pressure 120/80 or less, maintaining a healthy lipid/cholesterol profile, preventing diabetes or keeping diabetes numbers under good control, not smoking or using tobacco products, exercising most days per week or at  least 150 minutes per week of exercise, and eating healthy variety of fruits and vegetables, healthy oils, and avoiding unhealthy food choices like fried food, fast food, high sugar and high cholesterol foods.    Other tests may possibly include EKG test, CT coronary calcium score, echocardiogram, exercise treadmill stress test.     Vascular disease screening: For higher risk individuals including smokers, diabetics, patients with known heart disease or high blood pressure, kidney disease, and others, screening for vascular disease or atherosclerosis of the arteries is available.  Examples may include carotid ultrasound, abdominal aortic ultrasound, ABI blood flow screening in the legs, thoracic aorta screening.   Medical care options: I recommend you continue to seek care here first for routine care.  We try really hard to have available appointments Monday through Friday daytime hours for sick visits, acute visits, and physicals.  Urgent care should be  used for after hours and weekends for significant issues that cannot wait till the next day.  The emergency department should be used for significant potentially life-threatening emergencies.  The emergency department is expensive, can often have long wait times for less significant concerns, so try to utilize primary care, urgent care, or telemedicine when possible to avoid unnecessary trips to the emergency department.  Virtual visits and telemedicine have been introduced since the pandemic started in 2020, and can be convenient ways to receive medical care.  We offer virtual appointments as well to assist you in a variety of options to seek medical care.   Legal  Take the time to do a last will and testament, Advanced Directives including Health Care Power of Attorney and Living Will documents.  Don't leave your family with burdens that can be handled ahead of time.   Advanced Directives: I recommend you consider completing a Health Care Power of Attorney and Living Will.   These documents respect your wishes and help alleviate burdens on your loved ones if you were to become terminally ill or be in a position to need those documents enforced.    You can complete Advanced Directives yourself, have them notarized, then have copies made for our office, for you and for anybody you feel should have them in safe keeping.  Or, you can have an attorney prepare these documents.   If you haven't updated your Last Will and Testament in a while, it may be worthwhile having an attorney prepare these documents together and save on some costs.       Makeba was seen today for annual exam.  Diagnoses and all orders for this visit:  Encounter for health maintenance examination in adult -     MM 3D SCREENING MAMMOGRAM BILATERAL BREAST -     CBC -     Comprehensive metabolic panel with GFR -     Lipid panel -     TSH -     Hemoglobin A1c -     Urinalysis, Routine w reflex microscopic  Vaccine  counseling  Encounter for lipid screening for cardiovascular disease -     Lipid panel  Screening mammogram for breast cancer -     MM 3D SCREENING MAMMOGRAM BILATERAL BREAST  Screening for hematuria or proteinuria -     Urinalysis, Routine w reflex microscopic  Prediabetes -     Hemoglobin A1c  Pain and swelling of left knee  BMI 40.0-44.9, adult (HCC)     I recommend follow up yearly for a routine physical.   [facility]

## 2023-12-14 ENCOUNTER — Ambulatory Visit: Payer: Self-pay | Admitting: Medical

## 2023-12-14 LAB — CBC
Hematocrit: 37.7 % (ref 34.0–46.6)
Hemoglobin: 11.7 g/dL (ref 11.1–15.9)
MCH: 26.8 pg (ref 26.6–33.0)
MCHC: 31 g/dL — ABNORMAL LOW (ref 31.5–35.7)
MCV: 86 fL (ref 79–97)
Platelets: 296 x10E3/uL (ref 150–450)
RBC: 4.37 x10E6/uL (ref 3.77–5.28)
RDW: 13.3 % (ref 11.7–15.4)
WBC: 5.5 x10E3/uL (ref 3.4–10.8)

## 2023-12-14 LAB — COMPREHENSIVE METABOLIC PANEL WITH GFR
ALT: 15 IU/L (ref 0–32)
AST: 14 IU/L (ref 0–40)
Albumin: 4.7 g/dL (ref 3.8–4.9)
Alkaline Phosphatase: 77 IU/L (ref 49–135)
BUN/Creatinine Ratio: 15 (ref 9–23)
BUN: 15 mg/dL (ref 6–24)
Bilirubin Total: 0.3 mg/dL (ref 0.0–1.2)
CO2: 24 mmol/L (ref 20–29)
Calcium: 10.2 mg/dL (ref 8.7–10.2)
Chloride: 102 mmol/L (ref 96–106)
Creatinine, Ser: 0.98 mg/dL (ref 0.57–1.00)
Globulin, Total: 3.3 g/dL (ref 1.5–4.5)
Glucose: 89 mg/dL (ref 70–99)
Potassium: 4 mmol/L (ref 3.5–5.2)
Sodium: 139 mmol/L (ref 134–144)
Total Protein: 8 g/dL (ref 6.0–8.5)
eGFR: 68 mL/min/1.73 (ref >59)

## 2023-12-14 LAB — LIPID PANEL
Cholesterol, Total: 222 mg/dL — AB (ref 100–199)
HDL: 78 mg/dL (ref >39)
LDL CALC COMMENT:: 2.8 ratio (ref 0.0–4.4)
LDL Chol Calc (NIH): 130 mg/dL — AB (ref 0–99)
Triglycerides: 80 mg/dL (ref 0–149)
VLDL Cholesterol Cal: 14 mg/dL (ref 5–40)

## 2023-12-14 LAB — URINALYSIS, ROUTINE W REFLEX MICROSCOPIC
Bilirubin, UA: NEGATIVE
Glucose, UA: NEGATIVE
Ketones, UA: NEGATIVE
Leukocytes,UA: NEGATIVE
Nitrite, UA: NEGATIVE
Protein,UA: NEGATIVE
RBC, UA: NEGATIVE
Specific Gravity, UA: 1.013 (ref 1.005–1.030)
Urobilinogen, Ur: 0.2 mg/dL (ref 0.2–1.0)
pH, UA: 5.5 (ref 5.0–7.5)

## 2023-12-14 LAB — HEMOGLOBIN A1C
Est. average glucose Bld gHb Est-mCnc: 120 mg/dL
Hgb A1c MFr Bld: 5.8 % — ABNORMAL HIGH (ref 4.8–5.6)

## 2023-12-14 LAB — TSH: TSH: 6.71 u[IU]/mL — AB (ref 0.450–4.500)

## 2023-12-14 NOTE — Progress Notes (Signed)
 Sabrina-please add free T4 and call patient about results  Blood counts are normal.  Thyroid  is a little abnormal.  We will add on a additional thyroid  lab  Diabetes marker 5.8% at risk for diabetes.  Liver kidney electrolytes normal.  Urine normal.  LDL cholesterol is 130.  We like to see this little bit lower  I recommend referral to nutritionist to discuss cholesterol, sugars and efforts to lose weight  I recommend considering medication such as Wegovy or Zepbound injectables to help lose weight and to help with lowering risk of diabetes.  I am not sure if your insurance covers these.  I recommend you call and see if your insurance will cover these.

## 2023-12-15 NOTE — Progress Notes (Signed)
 Was she agreeable to any of the recommendations?  Her T4 thyroid  lab was okay.  She has subclinical hypothyroidism.  We will continue to monitor this.  No medication needed yet regarding thyroid 

## 2023-12-16 LAB — T4, FREE: Free T4: 1.12 ng/dL (ref 0.82–1.77)

## 2023-12-16 LAB — SPECIMEN STATUS REPORT

## 2023-12-28 ENCOUNTER — Other Ambulatory Visit: Payer: Self-pay | Admitting: Medical

## 2024-02-07 ENCOUNTER — Ambulatory Visit
Admission: RE | Admit: 2024-02-07 | Discharge: 2024-02-07 | Disposition: A | Source: Ambulatory Visit | Attending: Medical | Admitting: Medical

## 2024-12-17 ENCOUNTER — Encounter: Payer: Self-pay | Admitting: Medical
